# Patient Record
Sex: Female | Born: 1970 | Race: White | Hispanic: No | Marital: Married | Smoking: Never smoker
Health system: Southern US, Community
[De-identification: ages and names within clinical notes are randomized; demographics above are authoritative.]

## PROBLEM LIST (undated history)

## (undated) DIAGNOSIS — R5383 Other fatigue: Secondary | ICD-10-CM

## (undated) DIAGNOSIS — C801 Malignant (primary) neoplasm, unspecified: Secondary | ICD-10-CM

## (undated) DIAGNOSIS — J45990 Exercise induced bronchospasm: Secondary | ICD-10-CM

## (undated) DIAGNOSIS — G4733 Obstructive sleep apnea (adult) (pediatric): Secondary | ICD-10-CM

## (undated) DIAGNOSIS — K219 Gastro-esophageal reflux disease without esophagitis: Secondary | ICD-10-CM

## (undated) DIAGNOSIS — F419 Anxiety disorder, unspecified: Secondary | ICD-10-CM

## (undated) DIAGNOSIS — R109 Unspecified abdominal pain: Secondary | ICD-10-CM

## (undated) DIAGNOSIS — M7918 Myalgia, other site: Secondary | ICD-10-CM

## (undated) DIAGNOSIS — R519 Headache, unspecified: Secondary | ICD-10-CM

## (undated) DIAGNOSIS — G478 Other sleep disorders: Secondary | ICD-10-CM

## (undated) DIAGNOSIS — K2900 Acute gastritis without bleeding: Secondary | ICD-10-CM

## (undated) DIAGNOSIS — R0789 Other chest pain: Secondary | ICD-10-CM

## (undated) DIAGNOSIS — R51 Headache: Secondary | ICD-10-CM

## (undated) HISTORY — DX: Gastro-esophageal reflux disease without esophagitis: K21.9

## (undated) HISTORY — PX: TONSILLECTOMY: SHX5217

## (undated) HISTORY — DX: Acute gastritis without bleeding: K29.00

## (undated) HISTORY — DX: Obstructive sleep apnea (adult) (pediatric): G47.33

## (undated) HISTORY — DX: Unspecified abdominal pain: R10.9

## (undated) HISTORY — PX: TONSILLECTOMY: SUR1361

## (undated) HISTORY — DX: Exercise induced bronchospasm: J45.990

## (undated) HISTORY — DX: Other fatigue: R53.83

## (undated) HISTORY — DX: Myalgia, other site: M79.18

## (undated) HISTORY — DX: Other sleep disorders: G47.8

## (undated) HISTORY — DX: Other chest pain: R07.89

---

## 2001-02-13 HISTORY — PX: LAPAROSCOPY: SHX197

## 2001-02-13 HISTORY — PX: COLONOSCOPY: SHX5424

## 2007-08-26 ENCOUNTER — Ambulatory Visit: Payer: Self-pay

## 2009-01-26 ENCOUNTER — Ambulatory Visit: Payer: Self-pay | Admitting: Family Medicine

## 2009-04-12 ENCOUNTER — Ambulatory Visit: Payer: Self-pay | Admitting: Family Medicine

## 2011-04-10 IMAGING — CR DG CHEST 2V
1 series · 2 of 2 positions shown · non-contrast
Comparison: none

REASON FOR EXAM: chronic cough
COMMENTS:

PROCEDURE:     MDR - MDR CHEST PA(OR AP) AND LATERAL  - April 12, 2009  [DATE]
RESULT:     The lungs are clear. The cardiac silhouette and visualized bony
skeleton are unremarkable.

[Series 1: view not recorded · 0.17mm/px · 2 of 2 slices shown]
[im 1/2]
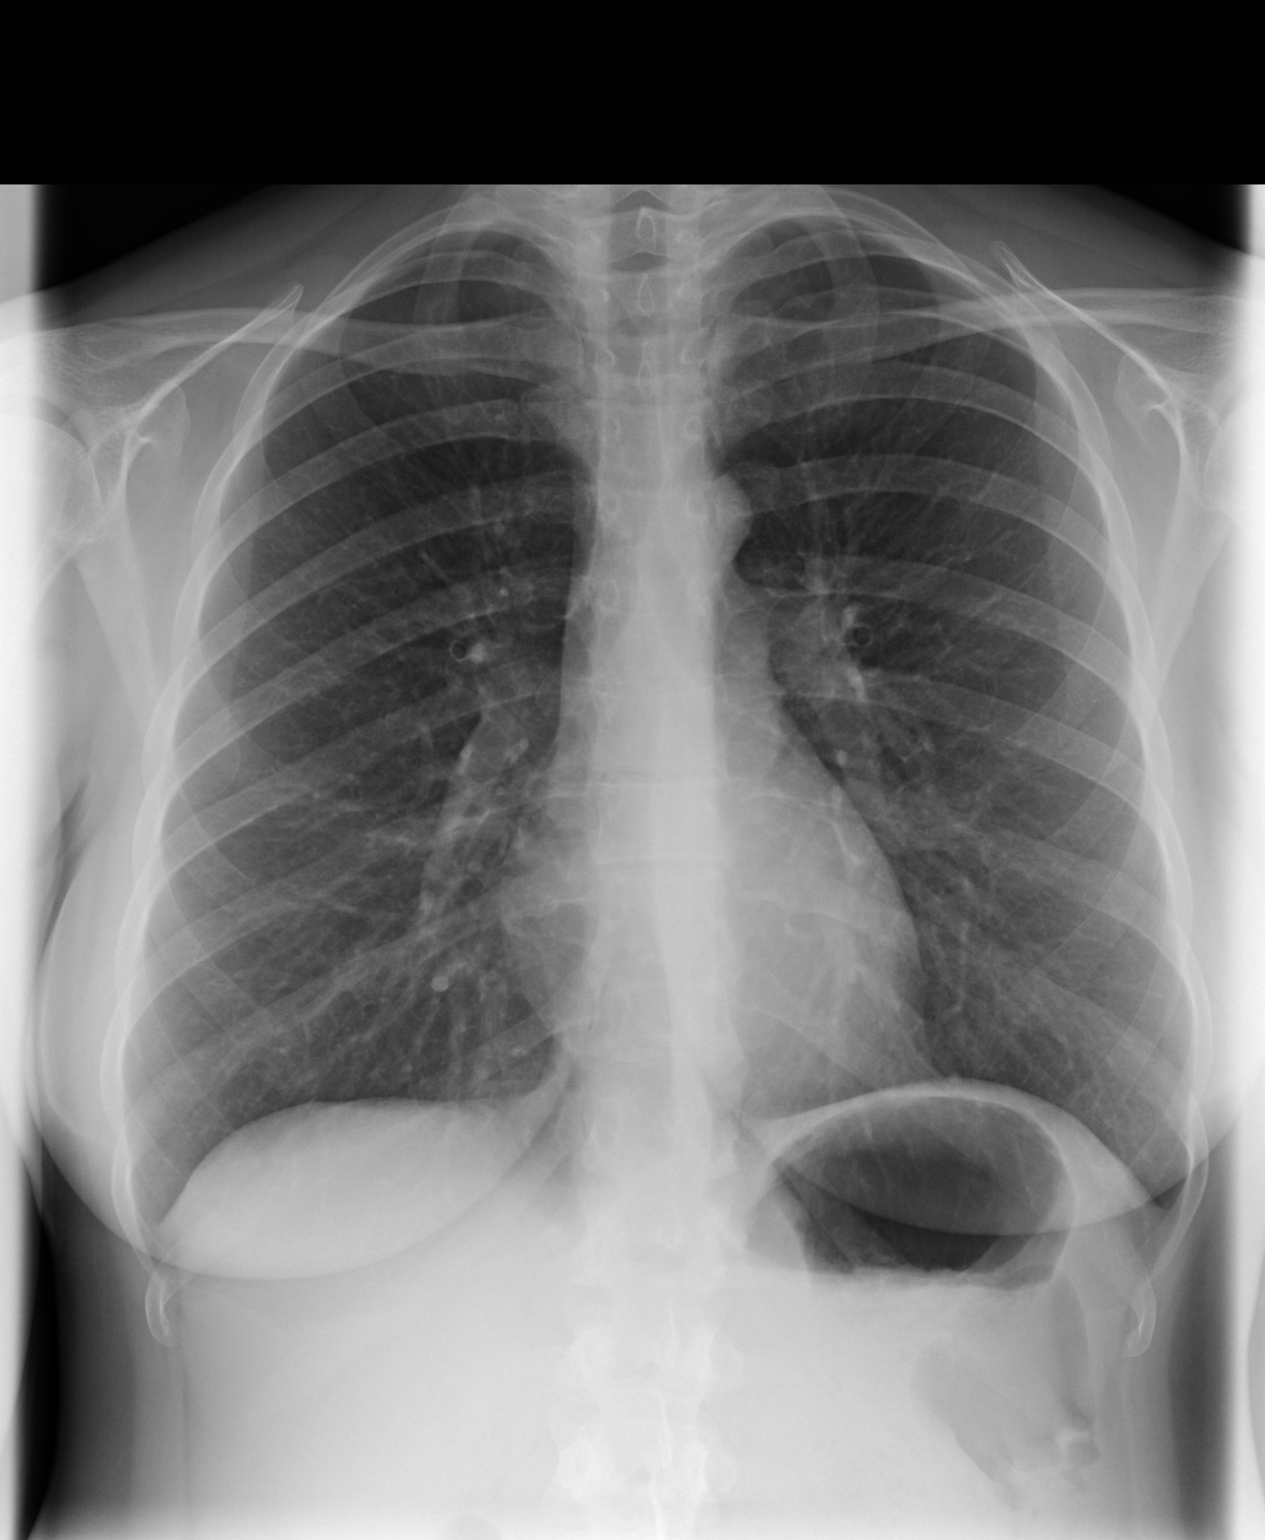
[im 2/2]
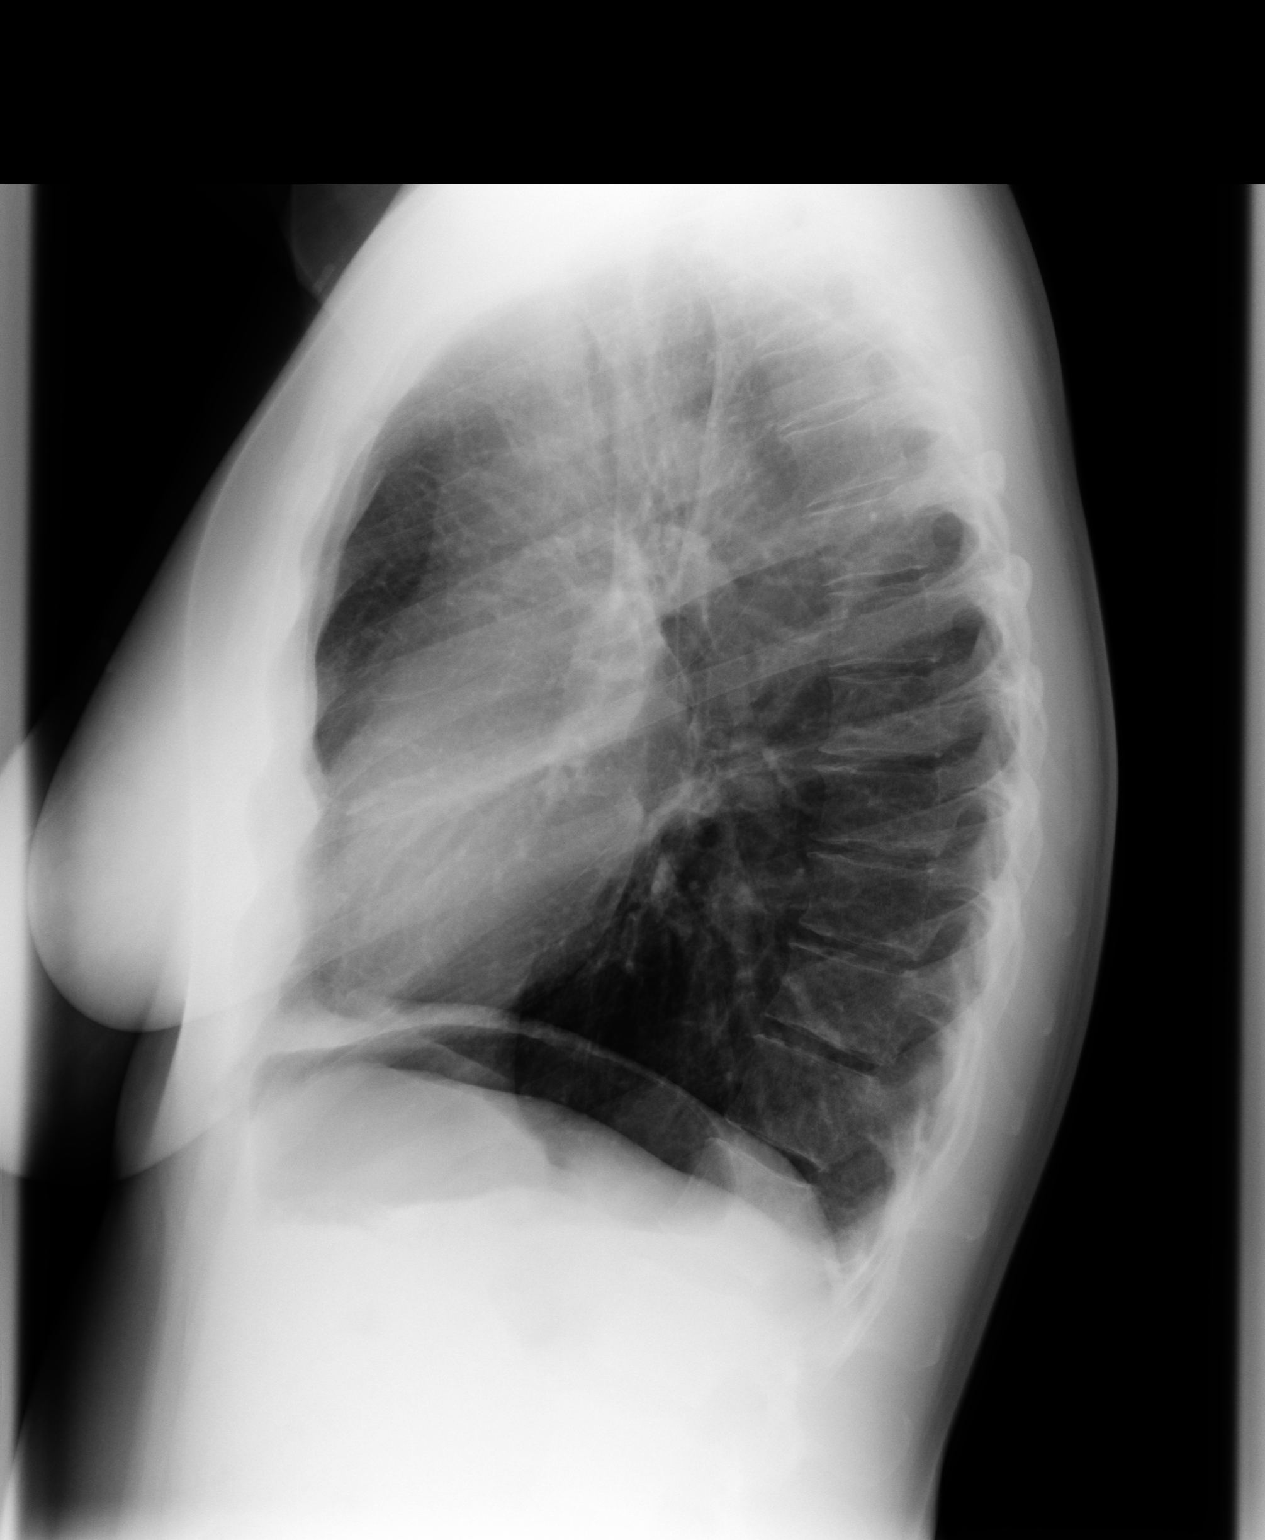

[2 of 2 positions shown; findings below may reference images not displayed]

IMPRESSION: 1. Chest radiograph without evidence of acute cardiopulmonary disease.

## 2012-11-21 LAB — HM PAP SMEAR: HM Pap smear: NEGATIVE

## 2012-11-22 ENCOUNTER — Ambulatory Visit: Payer: Self-pay | Admitting: Obstetrics and Gynecology

## 2012-11-29 ENCOUNTER — Ambulatory Visit: Payer: Self-pay | Admitting: Obstetrics and Gynecology

## 2013-11-25 ENCOUNTER — Ambulatory Visit: Payer: Self-pay | Admitting: Obstetrics and Gynecology

## 2014-06-05 ENCOUNTER — Ambulatory Visit
Admit: 2014-06-05 | Disposition: A | Discharge status: Home / Self Care | Payer: Self-pay | Attending: Family Medicine | Admitting: Family Medicine

## 2014-12-01 ENCOUNTER — Encounter: Payer: Self-pay | Admitting: Obstetrics and Gynecology

## 2014-12-22 ENCOUNTER — Encounter: Payer: Self-pay | Admitting: Obstetrics and Gynecology

## 2014-12-28 ENCOUNTER — Encounter: Payer: Self-pay | Admitting: Family Medicine

## 2014-12-28 ENCOUNTER — Ambulatory Visit (INDEPENDENT_AMBULATORY_CARE_PROVIDER_SITE_OTHER): Payer: BC Managed Care – PPO | Admitting: Family Medicine

## 2014-12-28 ENCOUNTER — Other Ambulatory Visit: Payer: Self-pay | Admitting: Family Medicine

## 2014-12-28 VITALS — BP 104/64 | HR 110 | Temp 98.2°F | Resp 16 | Ht 66.0 in | Wt 150.0 lb

## 2014-12-28 DIAGNOSIS — F411 Generalized anxiety disorder: Secondary | ICD-10-CM | POA: Diagnosis not present

## 2014-12-28 DIAGNOSIS — G478 Other sleep disorders: Secondary | ICD-10-CM | POA: Insufficient documentation

## 2014-12-28 DIAGNOSIS — J45990 Exercise induced bronchospasm: Secondary | ICD-10-CM

## 2014-12-28 DIAGNOSIS — R5383 Other fatigue: Secondary | ICD-10-CM | POA: Insufficient documentation

## 2014-12-28 MED ORDER — ALBUTEROL SULFATE HFA 108 (90 BASE) MCG/ACT IN AERS: 1.0000 | INHALATION_SPRAY | Freq: Four times a day (QID) | RESPIRATORY_TRACT | Status: DC | PRN

## 2014-12-28 MED ORDER — LORAZEPAM 0.5 MG PO TABS: 0.5000 mg | ORAL_TABLET | Freq: Two times a day (BID) | ORAL | Status: DC | PRN

## 2014-12-28 MED ORDER — VENLAFAXINE HCL ER 37.5 MG PO CP24: 37.5000 mg | ORAL_CAPSULE | Freq: Every day | ORAL | Status: DC

## 2014-12-28 NOTE — Progress Notes (Signed)
Name: Dana Fitzpatrick   MRN: [id number]    DOB: [birth year]   Date:12/28/2014       Progress Note  Subjective  Chief Complaint  Chief Complaint  Patient presents with  . Medication Management    HPI  Dana Fitzpatrick is a 44 year old female here today for refill of her prn albuterol inhaler. Mostly cold air and exercise induced bronchospasm with good response to rescue inhaler. Her insurance coverage will change Jan 2017 and albuterol may not be on it any more. Otherwise she reports continued chronic fatigue and if willing to accept it may be mood disorder related. Her mother was an anxious person and her sister is on Prozac, Wellbutrin and prn Clonazepam.   Anxiety: Patient complains of anxiety disorder, panic attacks and sleep disturbance.  She has the following symptoms: difficulty concentrating, fatigue, feelings of losing control, insomnia, irritable, racing thoughts. Onset of symptoms was approximately several years ago, gradually worsening since that time. She denies current suicidal and homicidal ideation. Family history significant for anxiety and depression.Possible organic causes contributing are: none. Risk factors: none Previous treatment includes none.    Past Medical History  Diagnosis Date  . Muscular abdominal pain in left flank   . Other fatigue   . Unrefreshed by sleep   . Mild exercise-induced asthma   . Acute superficial gastritis without hemorrhage   . Musculoskeletal chest pain     Patient Active Problem List   Diagnosis Date Noted  . Fatigue 12/28/2014  . Asthma, exercise induced 12/28/2014  . Non-restorative sleep 12/28/2014    Social History  Substance Use Topics  . Smoking status: Never Smoker   . Smokeless tobacco: Not on file  . Alcohol Use: 0.0 oz/week    0 Standard drinks or equivalent per week     Comment: occasional     Current outpatient prescriptions:  .  albuterol (VENTOLIN HFA) 108 (90 BASE) MCG/ACT inhaler, Inhale 1-2 puffs into  the lungs every 6 (six) hours as needed., Disp: , Rfl:  .  Cetirizine HCl (ZYRTEC ALLERGY) 10 MG CAPS, Take 1 capsule by mouth daily., Disp: , Rfl:  .  cyclobenzaprine (FLEXERIL) 5 MG tablet, Take 1-2 tablets by mouth at bedtime as needed., Disp: , Rfl:  .  ibuprofen (ADVIL,MOTRIN) 800 MG tablet, Take 1 tablet by mouth every 8 (eight) hours as needed., Disp: , Rfl:   Past Surgical History  Procedure Laterality Date  . Tonsillectomy    . Colonoscopy  2003    normal    Family History  Problem Relation Age of Onset  . Heart disease Mother   . Dementia Mother   . Heart attack Father     No Known Allergies   Review of Systems  CONSTITUTIONAL: No significant weight changes, fever, chills, weakness or fatigue.  SKIN: No rash or itching.  CARDIOVASCULAR: No chest pain, chest pressure or chest discomfort. No palpitations or edema.  RESPIRATORY: No shortness of breath, cough or sputum.  PSYCHIATRIC: Yes change in mood. No change in sleep pattern.  ENDOCRINOLOGIC: No reports of sweating, cold or heat intolerance. No polyuria or polydipsia.     Objective  BP 104/64 mmHg  Pulse 110  Temp(Src) 98.2 F (36.8 C) (Oral)  Resp 16  Ht 5\' 6"  (1.676 m)  Wt 150 lb (68.04 kg)  BMI 24.22 kg/m2  SpO2 96%  LMP 12/20/2014 (Exact Date) Body mass index is 24.22 kg/(m^2).  Physical Exam  Constitutional: Patient appears well-developed and well-nourished. In no  distress.  Cardiovascular: Normal rate, regular rhythm and normal heart sounds.  No murmur heard.  Pulmonary/Chest: Effort normal and breath sounds normal. No respiratory distress. Neurological: CN II-XII grossly intact with no focal deficits. Alert and oriented to person, place, and time. Coordination, balance, strength, speech and gait are normal.  Skin: Skin is warm and dry. No rash noted. No erythema.  Psychiatric: Patient has a anxious mood and affect. Behavior is normal in office today. Judgment and thought content normal in office  today.  Assessment & Plan   1. Asthma, exercise induced Refilled. Well controled.  - albuterol (VENTOLIN HFA) 108 (90 BASE) MCG/ACT inhaler; Inhale 1-2 puffs into the lungs every 6 (six) hours as needed.  Dispense: 18 g; Refill: 5  2. GAD (generalized anxiety disorder) New diagnosis. Will try Effexor XR along with PRN use of lorazepam. The patient has been counseled on the proper use, side effects and potential interactions of the new medication. Patient encouraged to review the side effects and safety profile pamphlet provided with the prescription from the pharmacy as well as request counseling from the pharmacy team as needed.   - venlafaxine XR (EFFEXOR-XR) 37.5 MG 24 hr capsule; Take 1 capsule (37.5 mg total) by mouth daily with breakfast.  Dispense: 30 capsule; Refill: 3 - lorazepam (ativan) 0.5 mg one po bid prn, #30, Refill: 3

## 2015-02-01 ENCOUNTER — Encounter: Payer: Self-pay | Admitting: *Deleted

## 2015-02-04 ENCOUNTER — Encounter: Payer: Self-pay | Admitting: Obstetrics and Gynecology

## 2015-02-04 ENCOUNTER — Ambulatory Visit (INDEPENDENT_AMBULATORY_CARE_PROVIDER_SITE_OTHER): Payer: BC Managed Care – PPO | Admitting: Obstetrics and Gynecology

## 2015-02-04 VITALS — BP 125/81 | HR 85 | Ht 65.0 in | Wt 147.0 lb

## 2015-02-04 DIAGNOSIS — Z01419 Encounter for gynecological examination (general) (routine) without abnormal findings: Secondary | ICD-10-CM | POA: Diagnosis not present

## 2015-02-04 NOTE — Progress Notes (Signed)
Subjective:   Dana Fitzpatrick is a 44 y.o. No obstetric history on file. Caucasian female here for a routine well-woman exam.  Patient's last menstrual period was 12/20/2014 (exact date).    Current complaints: none PCP: Sudaram       Does need &  desire labs  Social History: Sexual: heterosexual Marital Status: married Living situation: with spouse Occupation: Pharmacist, hospital at [employer] [place]: no tobacco use Illicit drugs: no history of illicit drug use  The following portions of the patient's history were reviewed and updated as appropriate: allergies, current medications, past family history, past medical history, past social history, past surgical history and problem list.  Past Medical History Past Medical History  Diagnosis Date  . Muscular abdominal pain in left flank   . Other fatigue   . Unrefreshed by sleep   . Mild exercise-induced asthma   . Acute superficial gastritis without hemorrhage   . Musculoskeletal chest pain   . GERD (gastroesophageal reflux disease)     Past Surgical History Past Surgical History  Procedure Laterality Date  . Tonsillectomy    . Colonoscopy  2003    normal  . Laparoscopy  2003    Gynecologic History No obstetric history on file.  Patient's last menstrual period was 12/20/2014 (exact date). Contraception: vasectomy Last Pap: 2015. Results were: normal Last mammogram: 2015. Results were: normal  Obstetric History OB History  No data available    Current Medications Current Outpatient Prescriptions on File Prior to Visit  Medication Sig Dispense Refill  . albuterol (VENTOLIN HFA) 108 (90 BASE) MCG/ACT inhaler Inhale 1-2 puffs into the lungs every 6 (six) hours as needed. 18 g 5  . albuterol (VENTOLIN HFA) 108 (90 BASE) MCG/ACT inhaler Inhale 1-2 puffs into the lungs every 6 (six) hours as needed. 18 g 5  . Cetirizine HCl (ZYRTEC ALLERGY) 10 MG CAPS Take 1 capsule by mouth daily.    . cyclobenzaprine (FLEXERIL) 5  MG tablet Take 1-2 tablets by mouth at bedtime as needed.    Marland Kitchen ibuprofen (ADVIL,MOTRIN) 800 MG tablet Take 1 tablet by mouth every 8 (eight) hours as needed.    Marland Kitchen LORazepam (ATIVAN) 0.5 MG tablet Take 1 tablet (0.5 mg total) by mouth 2 (two) times daily as needed for anxiety. 30 tablet 3  . venlafaxine XR (EFFEXOR-XR) 37.5 MG 24 hr capsule Take 1 capsule (37.5 mg total) by mouth daily with breakfast. 30 capsule 3   No current facility-administered medications on file prior to visit.    Review of Systems Patient denies any headaches, blurred vision, shortness of breath, chest pain, abdominal pain, problems with bowel movements, urination, or intercourse.  Objective:  BP 125/81 mmHg  Pulse 85  Ht 5\' 5"  (1.651 m)  Wt 147 lb (66.679 kg)  BMI 24.46 kg/m2  LMP 12/20/2014 (Exact Date) Physical Exam  General:  Well developed, well nourished, no acute distress. She is alert and oriented x3. Skin:  Warm and dry Neck:  Midline trachea, no thyromegaly or nodules Cardiovascular: Regular rate and rhythm, no murmur heard Lungs:  Effort normal, all lung fields clear to auscultation bilaterally Breasts:  No dominant palpable mass, retraction, or nipple discharge Abdomen:  Soft, non tender, no hepatosplenomegaly or masses Pelvic:  External genitalia is normal in appearance.  The vagina is normal in appearance. The cervix is bulbous, no CMT.  Thin prep pap is not done . Uterus is felt to be normal size, shape, and contour.  No adnexal masses or tenderness noted.*  Extremities:  No swelling or varicosities noted Psych:  She has a normal mood and affect  Assessment:   Healthy well-woman exam Anxiety improved with SNRI ( taking for 6 weeks)  Plan:  Routine screening labs obtained F/U 1 year for AE, or sooner if needed Mammogram scheduled  Maanav Kassabian Rockney Ghee, CNM

## 2015-02-04 NOTE — Patient Instructions (Signed)
Place annual gynecologic exam patient instructions here.

## 2015-02-05 ENCOUNTER — Telehealth: Payer: Self-pay | Admitting: *Deleted

## 2015-02-05 ENCOUNTER — Other Ambulatory Visit: Payer: Self-pay | Admitting: Obstetrics and Gynecology

## 2015-02-05 DIAGNOSIS — E559 Vitamin D deficiency, unspecified: Secondary | ICD-10-CM | POA: Insufficient documentation

## 2015-02-05 LAB — COMPREHENSIVE METABOLIC PANEL
A/G RATIO: 1.6 (ref 1.1–2.5)
ALT: 10 IU/L (ref 0–32)
AST: 19 IU/L (ref 0–40)
Albumin: 4.2 g/dL (ref 3.5–5.5)
Alkaline Phosphatase: 80 IU/L (ref 39–117)
BUN/Creatinine Ratio: 13 (ref 9–23)
BUN: 10 mg/dL (ref 6–24)
Bilirubin Total: 0.5 mg/dL (ref 0.0–1.2)
CALCIUM: 9.3 mg/dL (ref 8.7–10.2)
CO2: 22 mmol/L (ref 18–29)
CREATININE: 0.75 mg/dL (ref 0.57–1.00)
Chloride: 99 mmol/L (ref 96–106)
GFR, EST AFRICAN AMERICAN: 112 mL/min/{1.73_m2} (ref >59)
GFR, EST NON AFRICAN AMERICAN: 97 mL/min/{1.73_m2} (ref >59)
Globulin, Total: 2.7 g/dL (ref 1.5–4.5)
Glucose: 78 mg/dL (ref 65–99)
POTASSIUM: 4.3 mmol/L (ref 3.5–5.2)
Sodium: 138 mmol/L (ref 134–144)
TOTAL PROTEIN: 6.9 g/dL (ref 6.0–8.5)

## 2015-02-05 LAB — LIPID PANEL
CHOL/HDL RATIO: 3.4 ratio (ref 0.0–4.4)
Cholesterol, Total: 204 mg/dL — ABNORMAL HIGH (ref 100–199)
HDL: 60 mg/dL (ref >39)
LDL Calculated: 117 mg/dL — ABNORMAL HIGH (ref 0–99)
TRIGLYCERIDES: 137 mg/dL (ref 0–149)
VLDL Cholesterol Cal: 27 mg/dL (ref 5–40)

## 2015-02-05 LAB — VITAMIN D 25 HYDROXY (VIT D DEFICIENCY, FRACTURES): Vit D, 25-Hydroxy: 21 ng/mL — ABNORMAL LOW (ref 30.0–100.0)

## 2015-02-05 MED ORDER — VITAMIN D (ERGOCALCIFEROL) 1.25 MG (50000 UNIT) PO CAPS: 50000.0000 [IU] | ORAL_CAPSULE | ORAL | Status: DC

## 2015-02-05 NOTE — Telephone Encounter (Signed)
Mailed info to pt-ac

## 2015-02-05 NOTE — Telephone Encounter (Signed)
-----   Message from Joylene Igo, [place] sent at 02/05/2015  8:16 AM EST ----- Please mail info on vit D def.

## 2015-02-09 ENCOUNTER — Other Ambulatory Visit: Payer: Self-pay | Admitting: Obstetrics and Gynecology

## 2015-02-09 ENCOUNTER — Ambulatory Visit
Admission: RE | Admit: 2015-02-09 | Discharge: 2015-02-09 | Disposition: A | Discharge status: Home / Self Care | Payer: BC Managed Care – PPO | Source: Ambulatory Visit | Attending: Obstetrics and Gynecology | Admitting: Obstetrics and Gynecology

## 2015-02-09 DIAGNOSIS — Z1231 Encounter for screening mammogram for malignant neoplasm of breast: Secondary | ICD-10-CM | POA: Diagnosis not present

## 2015-02-09 DIAGNOSIS — R928 Other abnormal and inconclusive findings on diagnostic imaging of breast: Secondary | ICD-10-CM

## 2015-02-09 DIAGNOSIS — Z01419 Encounter for gynecological examination (general) (routine) without abnormal findings: Secondary | ICD-10-CM

## 2015-02-23 ENCOUNTER — Ambulatory Visit
Admission: RE | Admit: 2015-02-23 | Discharge: 2015-02-23 | Disposition: A | Discharge status: Home / Self Care | Payer: BC Managed Care – PPO | Source: Ambulatory Visit | Attending: Obstetrics and Gynecology | Admitting: Obstetrics and Gynecology

## 2015-02-23 DIAGNOSIS — R928 Other abnormal and inconclusive findings on diagnostic imaging of breast: Secondary | ICD-10-CM

## 2015-02-23 DIAGNOSIS — N6001 Solitary cyst of right breast: Secondary | ICD-10-CM | POA: Insufficient documentation

## 2015-03-29 ENCOUNTER — Encounter: Payer: Self-pay | Admitting: Obstetrics and Gynecology

## 2015-04-09 ENCOUNTER — Ambulatory Visit (INDEPENDENT_AMBULATORY_CARE_PROVIDER_SITE_OTHER): Payer: BC Managed Care – PPO | Admitting: Family Medicine

## 2015-04-09 ENCOUNTER — Encounter: Payer: Self-pay | Admitting: Family Medicine

## 2015-04-09 VITALS — BP 124/82 | HR 97 | Temp 98.3°F | Resp 14 | Ht 65.0 in | Wt 152.0 lb

## 2015-04-09 DIAGNOSIS — J45990 Exercise induced bronchospasm: Secondary | ICD-10-CM

## 2015-04-09 DIAGNOSIS — F411 Generalized anxiety disorder: Secondary | ICD-10-CM

## 2015-04-09 DIAGNOSIS — J069 Acute upper respiratory infection, unspecified: Secondary | ICD-10-CM | POA: Insufficient documentation

## 2015-04-09 MED ORDER — AMOXICILLIN 875 MG PO TABS: 875.0000 mg | ORAL_TABLET | Freq: Two times a day (BID) | ORAL | Status: DC

## 2015-04-09 MED ORDER — VENLAFAXINE HCL ER 75 MG PO CP24: 75.0000 mg | ORAL_CAPSULE | Freq: Every day | ORAL | Status: DC

## 2015-04-09 MED ORDER — PREDNISONE 10 MG (21) PO TBPK: ORAL_TABLET | ORAL | Status: DC

## 2015-04-09 NOTE — Progress Notes (Signed)
Name: Dana Fitzpatrick   MRN: [id number]    DOB: [birth year]   Date:04/09/2015       Progress Note  Subjective  Chief Complaint  Chief Complaint  Patient presents with  . Medication Refill  . Depression  . Anxiety  . URI    HPI   Dana Fitzpatrick is a 45 year old female here today for general f/u and refills. Mostly cold air and exercise induced bronchospasm with good response to rescue inhaler. Otherwise she reports continued chronic fatigue and if willing to accept it may be mood disorder related. Her mother was an anxious person and her sister is on Prozac, Wellbutrin and prn Clonazepam.   Patient complains of anxiety disorder, panic attacks and sleep disturbance. She has the following symptoms: difficulty concentrating, fatigue, feelings of losing control, insomnia, irritable, racing thoughts. Onset of symptoms was approximately several years ago, gradually worsening since that time. She denies current suicidal and homicidal ideation. Family history significant for anxiety and depression. Possible organic causes contributing are: none. Risk factors: family history. Patient started on Effexor XR 37.5mg  one a day at previous visit along with prn use of lorazepam 0.5mg  po bid. She states the Effexor is helping and would like to increase dose. She did not need to try the lorazepam just yet.   Patient is also here today with concerns regarding the following symptoms sore throat, congestion, post nasal drip, ear pressure and non productive cough that started 5 days ago.  Associated with fatigue. Occasional dizziness. Not associated with fever. Has tried the following home remedies: allergy meds    Past Medical History  Diagnosis Date  . Muscular abdominal pain in left flank   . Other fatigue   . Unrefreshed by sleep   . Mild exercise-induced asthma   . Acute superficial gastritis without hemorrhage   . Musculoskeletal chest pain   . GERD (gastroesophageal reflux disease)     Social  History  Substance Use Topics  . Smoking status: Never Smoker   . Smokeless tobacco: Not on file  . Alcohol Use: 0.0 oz/week    0 Standard drinks or equivalent per week     Comment: occasional     Current outpatient prescriptions:  .  albuterol (VENTOLIN HFA) 108 (90 BASE) MCG/ACT inhaler, Inhale 1-2 puffs into the lungs every 6 (six) hours as needed., Disp: 18 g, Rfl: 5 .  Cetirizine HCl (ZYRTEC ALLERGY) 10 MG CAPS, Take 1 capsule by mouth daily., Disp: , Rfl:  .  cyclobenzaprine (FLEXERIL) 5 MG tablet, Take 1-2 tablets by mouth at bedtime as needed., Disp: , Rfl:  .  ibuprofen (ADVIL,MOTRIN) 800 MG tablet, Take 1 tablet by mouth every 8 (eight) hours as needed., Disp: , Rfl:  .  LORazepam (ATIVAN) 0.5 MG tablet, Take 1 tablet (0.5 mg total) by mouth 2 (two) times daily as needed for anxiety., Disp: 30 tablet, Rfl: 3 .  venlafaxine XR (EFFEXOR-XR) 37.5 MG 24 hr capsule, Take 1 capsule (37.5 mg total) by mouth daily with breakfast., Disp: 30 capsule, Rfl: 3 .  Vitamin D, Ergocalciferol, (DRISDOL) 50000 UNITS CAPS capsule, Take 1 capsule (50,000 Units total) by mouth every 7 (seven) days., Disp: 30 capsule, Rfl: 2  No Known Allergies  ROS  CONSTITUTIONAL: No significant weight changes, fever, chills, weakness. Yes chronic fatigue.  HEENT:  - Eyes: No visual changes.  - Ears: No auditory changes. No pain.  - Nose: Yes congestion. - Throat: No sore throat. No changes in swallowing. SKIN:  No rash or itching.  CARDIOVASCULAR: No chest pain, chest pressure or chest discomfort. No palpitations or edema.  RESPIRATORY: No shortness of breath, cough or sputum.  GASTROINTESTINAL: No anorexia, nausea, vomiting. No changes in bowel habits. No abdominal pain or blood.  NEUROLOGICAL: No headache, syncope, paralysis, ataxia, numbness or tingling in the extremities. No memory changes. No change in bowel or bladder control.  MUSCULOSKELETAL: No joint pain. No muscle pain. HEMATOLOGIC: No anemia,  bleeding or bruising.  LYMPHATICS: No enlarged lymph nodes.  PSYCHIATRIC: No change in mood. No change in sleep pattern.  ENDOCRINOLOGIC: No reports of sweating, cold or heat intolerance. No polyuria or polydipsia.      Objective  Filed Vitals:   04/09/15 0802  BP: 124/82  Pulse: 97  Temp: 98.3 F (36.8 C)  TempSrc: Oral  Resp: 14  Height: 5\' 5"  (1.651 m)  Weight: 152 lb (68.947 kg)  SpO2: 98%   Body mass index is 25.29 kg/(m^2).   Physical Exam  Constitutional: Patient appears well-developed and well-nourished. In no acute distress. HEENT:  - Head: Normocephalic and atraumatic.  - Ears: RIGHT TM bulging with minimal clear exudate, LEFT TM bulging with minimal clear exudate.  - Nose: Nasal mucosa boggy and congested.  - Mouth/Throat: Oropharynx is moist with slight erythema of bilateral tonsils without hypertrophy or exudates. Post nasal drainage present.  - Eyes: Conjunctivae clear, EOM movements normal. PERRLA. No scleral icterus.  Neck: Normal range of motion. Neck supple. No JVD present. No thyromegaly present. No local lymphadenopathy. Cardiovascular: Regular rate, regular rhythm with no murmurs heard.  Pulmonary/Chest: Effort normal and breath sounds clear in all lung fields.  Musculoskeletal: Normal range of motion bilateral UE and LE, no joint effusions. Skin: Skin is warm and dry. No rash noted. Psychiatric: Patient has a stable mood and affect. Behavior is normal in office today. Judgment and thought content normal in office today.   Assessment & Plan  1. GAD (generalized anxiety disorder) Improving. Increased Effexor to next dose.   - venlafaxine XR (EFFEXOR-XR) 75 MG 24 hr capsule; Take 1 capsule (75 mg total) by mouth daily with breakfast.  Dispense: 30 capsule; Refill: 5  2. Asthma, exercise induced Well controled.   3. Upper respiratory infection Etiologies include initial allergic rhinitis or viral infection at this time. Instructed patient on  increasing hydration, nasal saline spray, steam inhalation, NSAID if tolerated and not contraindicated. If not already doing so start taking daily anti-histamine and use a steroid nasal spray. If symptoms persist/worsen may consider antibiotic therapy and prednisone.   - amoxicillin (AMOXIL) 875 MG tablet; Take 1 tablet (875 mg total) by mouth 2 (two) times daily.  Dispense: 20 tablet; Refill: 0 - predniSONE (STERAPRED UNI-PAK 21 TAB) 10 MG (21) TBPK tablet; Use as directed in a 6 day taper PredPak  Dispense: 21 tablet; Refill: 0

## 2015-07-15 ENCOUNTER — Ambulatory Visit (INDEPENDENT_AMBULATORY_CARE_PROVIDER_SITE_OTHER): Payer: BC Managed Care – PPO | Admitting: Family Medicine

## 2015-07-15 ENCOUNTER — Encounter: Payer: Self-pay | Admitting: Family Medicine

## 2015-07-15 VITALS — BP 132/75 | HR 122 | Temp 98.5°F | Resp 19 | Ht 65.0 in | Wt 154.3 lb

## 2015-07-15 DIAGNOSIS — J0110 Acute frontal sinusitis, unspecified: Secondary | ICD-10-CM

## 2015-07-15 MED ORDER — FLUTICASONE PROPIONATE 50 MCG/ACT NA SUSP: 2.0000 | Freq: Every day | NASAL | Status: DC

## 2015-07-15 MED ORDER — BENZONATATE 200 MG PO CAPS: 200.0000 mg | ORAL_CAPSULE | Freq: Three times a day (TID) | ORAL | Status: DC | PRN

## 2015-07-15 MED ORDER — AMOXICILLIN-POT CLAVULANATE 875-125 MG PO TABS: 1.0000 | ORAL_TABLET | Freq: Two times a day (BID) | ORAL | Status: DC

## 2015-07-15 NOTE — Progress Notes (Signed)
Name: Dana Fitzpatrick   MRN: [id number]    DOB: October 07, 1970   Date:07/15/2015       Progress Note  Subjective  Chief Complaint  Chief Complaint  Patient presents with  . Acute Visit    Cough     Cough This is a new problem. The current episode started in the past 7 days. The cough is productive of sputum. Associated symptoms include ear congestion, a fever and nasal congestion. Pertinent negatives include no chest pain. Treatments tried: Cough drops, Mucinex (once), and Inhaler. Her past medical history is significant for environmental allergies. There is no history of asthma (Exercise/cold weather induced bronchospasm), bronchitis or COPD.   Pt. 'lost my voice' for four days starting last weekend, since then she has recovered her voice but feels congested, morning cough with sputum, and sinus pressure.    Past Medical History  Diagnosis Date  . Muscular abdominal pain in left flank   . Other fatigue   . Unrefreshed by sleep   . Mild exercise-induced asthma   . Acute superficial gastritis without hemorrhage   . Musculoskeletal chest pain   . GERD (gastroesophageal reflux disease)     Past Surgical History  Procedure Laterality Date  . Tonsillectomy    . Colonoscopy  2003    normal  . Laparoscopy  2003    Family History  Problem Relation Age of Onset  . Heart disease Mother   . Dementia Mother   . Heart attack Father   . Breast cancer Paternal Aunt 55    Social History   Social History  . Marital Status: Married    Spouse Name: N/A  . Number of Children: N/A  . Years of Education: N/A   Occupational History  . Not on file.   Social History Main Topics  . Smoking status: Never Smoker   . Smokeless tobacco: Not on file  . Alcohol Use: 0.0 oz/week    0 Standard drinks or equivalent per week     Comment: occasional  . Drug Use: No  . Sexual Activity:    Partners: Male   Other Topics Concern  . Not on file   Social History Narrative     Current  outpatient prescriptions:  .  albuterol (VENTOLIN HFA) 108 (90 BASE) MCG/ACT inhaler, Inhale 1-2 puffs into the lungs every 6 (six) hours as needed., Disp: 18 g, Rfl: 5 .  Cetirizine HCl (ZYRTEC ALLERGY) 10 MG CAPS, Take 1 capsule by mouth daily., Disp: , Rfl:  .  cyclobenzaprine (FLEXERIL) 5 MG tablet, Take 1-2 tablets by mouth at bedtime as needed., Disp: , Rfl:  .  ibuprofen (ADVIL,MOTRIN) 800 MG tablet, Take 1 tablet by mouth every 8 (eight) hours as needed., Disp: , Rfl:  .  LORazepam (ATIVAN) 0.5 MG tablet, Take 1 tablet (0.5 mg total) by mouth 2 (two) times daily as needed for anxiety., Disp: 30 tablet, Rfl: 3 .  venlafaxine XR (EFFEXOR-XR) 75 MG 24 hr capsule, Take 1 capsule (75 mg total) by mouth daily with breakfast., Disp: 30 capsule, Rfl: 5 .  Vitamin D, Ergocalciferol, (DRISDOL) 50000 UNITS CAPS capsule, Take 1 capsule (50,000 Units total) by mouth every 7 (seven) days., Disp: 30 capsule, Rfl: 2  No Known Allergies   Review of Systems  Constitutional: Positive for fever.  HENT: Positive for congestion.   Respiratory: Positive for cough and sputum production.   Cardiovascular: Negative for chest pain.  Endo/Heme/Allergies: Positive for environmental allergies.     Objective  Filed Vitals:   07/15/15 1406  BP: 132/75  Pulse: 122  Temp: 98.5 F (36.9 C)  TempSrc: Oral  Resp: 19  Height: 5\' 5"  (1.651 m)  Weight: 154 lb 4.8 oz (69.99 kg)  SpO2: 97%    Physical Exam  Constitutional: She is well-developed, well-nourished, and in no distress.  HENT:  Head: Normocephalic and atraumatic.  Right Ear: Tympanic membrane and ear canal normal.  Left Ear: Tympanic membrane and ear canal normal.  Nose: Right sinus exhibits frontal sinus tenderness. Left sinus exhibits frontal sinus tenderness.  Mouth/Throat: Posterior oropharyngeal erythema present. No oropharyngeal exudate or posterior oropharyngeal edema.  Nasal turbinate hypertrophy R worse than left, inflamed mucosa.    Cardiovascular: Regular rhythm and normal heart sounds.  Tachycardia present.   Pulmonary/Chest: Effort normal and breath sounds normal.  Nursing note and vitals reviewed.     Assessment & Plan  1. Acute non-recurrent frontal sinusitis Persistent symptoms, will start on antibiotic and nasal steroid therapy. - benzonatate (TESSALON) 200 MG capsule; Take 1 capsule (200 mg total) by mouth 3 (three) times daily as needed for cough.  Dispense: 20 capsule; Refill: 0 - amoxicillin-clavulanate (AUGMENTIN) 875-125 MG tablet; Take 1 tablet by mouth 2 (two) times daily.  Dispense: 20 tablet; Refill: 0 - fluticasone (FLONASE) 50 MCG/ACT nasal spray; Place 2 sprays into both nostrils daily.  Dispense: 16 g; Refill: 1   Neddie Steedman [facility] 07/15/2015 2:19 PM

## 2015-10-04 ENCOUNTER — Encounter: Payer: Self-pay | Admitting: Obstetrics and Gynecology

## 2015-10-11 ENCOUNTER — Other Ambulatory Visit: Payer: Self-pay

## 2015-10-11 DIAGNOSIS — F411 Generalized anxiety disorder: Secondary | ICD-10-CM

## 2015-10-11 MED ORDER — VENLAFAXINE HCL ER 75 MG PO CP24: 75.0000 mg | ORAL_CAPSULE | Freq: Every day | ORAL | 0 refills | Status: DC

## 2015-10-11 NOTE — Telephone Encounter (Signed)
Please ask pt to schedule an appt in the next month; I sent one month of Effexor XR in as requested; thank you

## 2015-10-12 NOTE — Telephone Encounter (Signed)
Left voice mail

## 2015-11-02 ENCOUNTER — Encounter: Payer: Self-pay | Admitting: Family Medicine

## 2015-11-02 ENCOUNTER — Ambulatory Visit (INDEPENDENT_AMBULATORY_CARE_PROVIDER_SITE_OTHER): Payer: BC Managed Care – PPO | Admitting: Family Medicine

## 2015-11-02 VITALS — BP 116/68 | HR 94 | Temp 98.7°F | Wt 164.2 lb

## 2015-11-02 DIAGNOSIS — R635 Abnormal weight gain: Secondary | ICD-10-CM | POA: Insufficient documentation

## 2015-11-02 DIAGNOSIS — R5383 Other fatigue: Secondary | ICD-10-CM | POA: Diagnosis not present

## 2015-11-02 DIAGNOSIS — N926 Irregular menstruation, unspecified: Secondary | ICD-10-CM

## 2015-11-02 DIAGNOSIS — F411 Generalized anxiety disorder: Secondary | ICD-10-CM

## 2015-11-02 DIAGNOSIS — E785 Hyperlipidemia, unspecified: Secondary | ICD-10-CM

## 2015-11-02 DIAGNOSIS — E559 Vitamin D deficiency, unspecified: Secondary | ICD-10-CM

## 2015-11-02 LAB — LIPID PANEL
Cholesterol: 200 mg/dL (ref 125–200)
HDL: 73 mg/dL (ref >=46)
LDL Cholesterol: 95 mg/dL (ref <130)
Total CHOL/HDL Ratio: 2.7 Ratio (ref <=5.0)
Triglycerides: 161 mg/dL — ABNORMAL HIGH (ref <150)
VLDL: 32 mg/dL — ABNORMAL HIGH (ref <30)

## 2015-11-02 LAB — CBC WITH DIFFERENTIAL/PLATELET
Basophils Absolute: 0 cells/uL (ref 0–200)
Basophils Relative: 0 %
EOS ABS: 76 {cells}/uL (ref 15–500)
Eosinophils Relative: 1 %
HEMATOCRIT: 42.3 % (ref 35.0–45.0)
HEMOGLOBIN: 14.4 g/dL (ref 11.7–15.5)
LYMPHS ABS: 1976 {cells}/uL (ref 850–3900)
Lymphocytes Relative: 26 %
MCH: 32.4 pg (ref 27.0–33.0)
MCHC: 34 g/dL (ref 32.0–36.0)
MCV: 95.3 fL (ref 80.0–100.0)
MONO ABS: 532 {cells}/uL (ref 200–950)
MONOS PCT: 7 %
MPV: 9.9 fL (ref 7.5–12.5)
NEUTROS ABS: 5016 {cells}/uL (ref 1500–7800)
Neutrophils Relative %: 66 %
Platelets: 321 10*3/uL (ref 140–400)
RBC: 4.44 MIL/uL (ref 3.80–5.10)
RDW: 13.1 % (ref 11.0–15.0)
WBC: 7.6 10*3/uL (ref 3.8–10.8)

## 2015-11-02 LAB — BASIC METABOLIC PANEL WITH GFR
BUN: 9 mg/dL (ref 7–25)
CO2: 27 mmol/L (ref 20–31)
CREATININE: 0.65 mg/dL (ref 0.50–1.10)
Calcium: 9.3 mg/dL (ref 8.6–10.2)
Chloride: 102 mmol/L (ref 98–110)
GFR, Est Non African American: >89 mL/min (ref >=60)
Glucose, Bld: 90 mg/dL (ref 65–99)
Potassium: 4.3 mmol/L (ref 3.5–5.3)
Sodium: 137 mmol/L (ref 135–146)

## 2015-11-02 LAB — TSH: TSH: 1.27 m[IU]/L

## 2015-11-02 LAB — T4, FREE: FREE T4: 0.9 ng/dL (ref 0.8–1.8)

## 2015-11-02 MED ORDER — VENLAFAXINE HCL ER 75 MG PO CP24: 75.0000 mg | ORAL_CAPSULE | Freq: Every day | ORAL | 3 refills | Status: DC

## 2015-11-02 NOTE — Assessment & Plan Note (Signed)
Check TSH and add medicine if indicated

## 2015-11-02 NOTE — Assessment & Plan Note (Signed)
Check vit D since it's a fat soluble vitamin

## 2015-11-02 NOTE — Assessment & Plan Note (Signed)
Check fasting lipids, limit saturated fats 

## 2015-11-02 NOTE — Progress Notes (Signed)
BP 116/68   Pulse 94   Temp 98.7 F (37.1 C)   Wt 164 lb 3.2 oz (74.5 kg)   LMP 11/02/2015   SpO2 97%   BMI 27.32 kg/m    Subjective:    Patient ID: Dana Fitzpatrick, female    DOB: 1970/05/05, 45 y.o.   MRN: [id number]  HPI: Dana Fitzpatrick is a 45 y.o. female  Chief Complaint  Patient presents with  . Medication Refill   Patient is here for f/u  She has gained ten pounds; she is tired, has been going on for a while; wanting to sleep all the time Periods are out of whack; , some are later than normal, skipping some; was two weeks and she contacted Melody's office, then spotted and  Some constipation, not too bad No thyroid disease in the family  Allergic rhinitis; on zyrtec and flonase; uses zyrtec D occasionally  She is on effexor and lorazepam; she is not using lorazepam; using venlafaxine only  She had pulled muscle in her back every now and again; was using flexeril and ibuprofen; not using now  Vitamin D deficiency; was on 50k weekly since Decv  High cholesterol; total 204; LDL 117; parents have high cholesterol; heart disease in the family; no chest pain; tries to eat a little healthier  Depression screen [facility] 2/9 11/02/2015 07/15/2015 04/09/2015 12/28/2014  Decreased Interest 0 0 0 0  Down, Depressed, Hopeless 0 0 0 0  PHQ - 2 Score 0 0 0 0   Relevant past medical, surgical, family and social history reviewed Past Medical History:  Diagnosis Date  . Acute superficial gastritis without hemorrhage   . GERD (gastroesophageal reflux disease)   . Mild exercise-induced asthma   . Muscular abdominal pain in left flank   . Musculoskeletal chest pain   . Other fatigue   . Unrefreshed by sleep    Past Surgical History:  Procedure Laterality Date  . COLONOSCOPY  2003   normal  . LAPAROSCOPY  2003  . TONSILLECTOMY     Family History  Problem Relation Age of Onset  . Heart disease Mother   . Dementia Mother   . Heart attack Father   . Breast cancer  Paternal Aunt 61   Social History  Substance Use Topics  . Smoking status: Never Smoker  . Smokeless tobacco: Not on file  . Alcohol use 0.0 oz/week     Comment: occasional   Interim medical history since last visit reviewed. Allergies and medications reviewed  Review of Systems Per HPI unless specifically indicated above     Objective:    BP 116/68   Pulse 94   Temp 98.7 F (37.1 C)   Wt 164 lb 3.2 oz (74.5 kg)   LMP 11/02/2015   SpO2 97%   BMI 27.32 kg/m   Wt Readings from Last 3 Encounters:  11/02/15 164 lb 3.2 oz (74.5 kg)  07/15/15 154 lb 4.8 oz (70 kg)  04/09/15 152 lb (68.9 kg)    Physical Exam  Constitutional: She appears well-developed and well-nourished. No distress.  Weight gain 10 pounds over last 3 months  HENT:  Head: Normocephalic and atraumatic.  Eyes: EOM are normal. No scleral icterus.  Neck: No thyromegaly present.  Cardiovascular: Normal rate, regular rhythm and normal heart sounds.   No murmur heard. Pulmonary/Chest: Effort normal and breath sounds normal. No respiratory distress. She has no wheezes.  Abdominal: Soft. Bowel sounds are normal. She exhibits no distension.  Musculoskeletal:  Normal range of motion. She exhibits no edema.  Neurological: She is alert. She exhibits normal muscle tone.  Reflex Scores:      Patellar reflexes are 2+ on the right side and 2+ on the left side. Skin: Skin is warm and dry. She is not diaphoretic. No pallor.  Psychiatric: She has a normal mood and affect. Her behavior is normal. Judgment and thought content normal.   Results for orders placed or performed in visit on 02/04/15  Lipid panel  Result Value Ref Range   Cholesterol, Total 204 (H) 100 - 199 mg/dL   Triglycerides 137 0 - 149 mg/dL   HDL 60 >39 mg/dL   VLDL Cholesterol Cal 27 5 - 40 mg/dL   LDL Calculated 117 (H) 0 - 99 mg/dL   Chol/HDL Ratio 3.4 0.0 - 4.4 ratio units  Comprehensive metabolic panel  Result Value Ref Range   Glucose 78 65 - 99  mg/dL   BUN 10 6 - 24 mg/dL   Creatinine, Ser 0.75 0.57 - 1.00 mg/dL   GFR calc non Af Amer 97 >59 mL/min/1.73   GFR calc Af Amer 112 >59 mL/min/1.73   BUN/Creatinine Ratio 13 9 - 23   Sodium 138 134 - 144 mmol/L   Potassium 4.3 3.5 - 5.2 mmol/L   Chloride 99 96 - 106 mmol/L   CO2 22 18 - 29 mmol/L   Calcium 9.3 8.7 - 10.2 mg/dL   Total Protein 6.9 6.0 - 8.5 g/dL   Albumin 4.2 3.5 - 5.5 g/dL   Globulin, Total 2.7 1.5 - 4.5 g/dL   Albumin/Globulin Ratio 1.6 1.1 - 2.5   Bilirubin Total 0.5 0.0 - 1.2 mg/dL   Alkaline Phosphatase 80 39 - 117 IU/L   AST 19 0 - 40 IU/L   ALT 10 0 - 32 IU/L  Vitamin D (25 hydroxy)  Result Value Ref Range   Vit D, 25-Hydroxy 21.0 (L) 30.0 - 100.0 ng/mL      Assessment & Plan:   Problem List Items Addressed This Visit      Other   Vitamin D deficiency    Check vit D since it's a fat soluble vitamin      Relevant Orders   VITAMIN D 25 Hydroxy (Vit-D Deficiency, Fractures)   Irregular periods    Check LH and FSH and TSH      Relevant Orders   Luteinizing hormone   Follicle stimulating hormone   GAD (generalized anxiety disorder) - Primary    Continue SNRI; stop benzo      Relevant Medications   venlafaxine XR (EFFEXOR-XR) 75 MG 24 hr capsule   Fatigue    Check TSH      Relevant Orders   BASIC METABOLIC PANEL WITH GFR   CBC with Differential/Platelet   Elevated fasting lipid profile    Check fasting lipids, limit saturated fats      Relevant Orders   Lipid panel   Abnormal weight gain    Check TSH and add medicine if indicated      Relevant Orders   TSH   T4, free    Other Visit Diagnoses   None.     Follow up plan: Return in about 6 months (around 05/01/2016).  An after-visit summary was printed and given to the patient at [place].  Please see the patient instructions which may contain other information and recommendations beyond what is mentioned above in the assessment and plan.  Meds ordered this encounter  Medications  . venlafaxine XR (EFFEXOR-XR) 75 MG 24 hr capsule    Sig: Take 1 capsule (75 mg total) by mouth daily with breakfast.    Dispense:  90 capsule    Refill:  3    Orders Placed This Encounter  Procedures  . VITAMIN D 25 Hydroxy (Vit-D Deficiency, Fractures)  . Luteinizing hormone  . Follicle stimulating hormone  . TSH  . T4, free  . BASIC METABOLIC PANEL WITH GFR  . Lipid panel  . CBC with Differential/Platelet   She says she will get her flu shot at work

## 2015-11-02 NOTE — Assessment & Plan Note (Signed)
Continue SNRI; stop benzo

## 2015-11-02 NOTE — Patient Instructions (Addendum)
Check out the information at [email or web address].org entitled "Nutrition for Weight Loss: What You Need to Know about Fad Diets" Try to lose between 1-2 pounds per week by taking in fewer calories and burning off more calories You can succeed by limiting portions, limiting foods dense in calories and fat, becoming more active, and drinking 8 glasses of water a day (64 ounces) Don't skip meals, especially breakfast, as skipping meals may alter your metabolism Do not use over-the-counter weight loss pills or gimmicks that claim rapid weight loss A healthy BMI (or body mass index) is between 18.5 and 24.9 You can calculate your ideal BMI at the NIH website http://www.nhlbi.nih.gov/health/educational/lose_wt/BMI/bmicalc.htm Try to limit saturated fats in your diet (bologna, hot dogs, barbeque, cheeseburgers, hamburgers, steak, bacon, sausage, cheese, etc.) and get more fresh fruits, vegetables, and whole grains Let's get labs today If you have not heard anything from my staff in a week about any orders/referrals/studies from today, please contact us here to follow-up [phone]  

## 2015-11-02 NOTE — Assessment & Plan Note (Signed)
Check TSH 

## 2015-11-02 NOTE — Assessment & Plan Note (Signed)
Check LH and FSH and TSH

## 2015-11-03 LAB — FOLLICLE STIMULATING HORMONE: FSH: 4.4 m[IU]/mL

## 2015-11-03 LAB — LUTEINIZING HORMONE: LH: 5.2 m[IU]/mL

## 2015-11-03 LAB — VITAMIN D 25 HYDROXY (VIT D DEFICIENCY, FRACTURES): VIT D 25 HYDROXY: 51 ng/mL (ref 30–100)

## 2015-11-08 ENCOUNTER — Telehealth: Payer: Self-pay | Admitting: Family Medicine

## 2015-11-08 NOTE — Telephone Encounter (Signed)
Pt requesting results on lab work done last week. Please advise.

## 2015-11-09 ENCOUNTER — Encounter: Payer: Self-pay | Admitting: Family Medicine

## 2015-11-09 ENCOUNTER — Encounter: Payer: Self-pay | Admitting: Obstetrics and Gynecology

## 2015-11-09 NOTE — Telephone Encounter (Signed)
Patient notified

## 2015-11-09 NOTE — Telephone Encounter (Signed)
Please let patient know that her labs overall look very good Her female hormones show that she is NOT in menopause or perimenopause Her thyroid testing is normal Her vitamin D has really come up nicely, from 21 to 51 She can take 800 to 1000 iu of vitamin D3 once a day to keep that up; don't take more than 1000 iu daily though Her LDL has improved, dropping from 117 to 95; that's great

## 2015-11-24 ENCOUNTER — Other Ambulatory Visit: Payer: Self-pay | Admitting: Obstetrics and Gynecology

## 2015-11-24 DIAGNOSIS — N938 Other specified abnormal uterine and vaginal bleeding: Secondary | ICD-10-CM

## 2015-11-25 ENCOUNTER — Ambulatory Visit (INDEPENDENT_AMBULATORY_CARE_PROVIDER_SITE_OTHER): Payer: BC Managed Care – PPO | Admitting: Obstetrics and Gynecology

## 2015-11-25 ENCOUNTER — Ambulatory Visit (INDEPENDENT_AMBULATORY_CARE_PROVIDER_SITE_OTHER): Payer: BC Managed Care – PPO

## 2015-11-25 ENCOUNTER — Encounter: Payer: Self-pay | Admitting: Obstetrics and Gynecology

## 2015-11-25 VITALS — BP 144/96 | HR 88 | Ht 65.0 in | Wt 161.8 lb

## 2015-11-25 DIAGNOSIS — N938 Other specified abnormal uterine and vaginal bleeding: Secondary | ICD-10-CM

## 2015-11-25 DIAGNOSIS — N939 Abnormal uterine and vaginal bleeding, unspecified: Secondary | ICD-10-CM

## 2015-11-25 DIAGNOSIS — D259 Leiomyoma of uterus, unspecified: Secondary | ICD-10-CM

## 2015-11-25 NOTE — Progress Notes (Signed)
Subjective:     Patient ID: Dana Fitzpatrick, female   DOB: 1970-11-13, 45 y.o.   MRN: [id number]  HPI Please see MyChart messages- here for ultrasound for AUB x 2 months, reports moderate cramping with heavier bleeding and has been spotting x 10 days after last episode of menstrual like bleeding.   Review of Systems Negative except stated in HPI    Objective:   Physical Exam   A&O x4  well groomed female in no distress Blood pressure (!) 144/96, pulse 88, height 5\' 5"  (1.651 m), weight 161 lb 12.8 oz (73.4 kg), last menstrual period 11/02/2015.  Ultrasound findings: Indications:AUB Findings:  The uterus measures 8.2 x 4.5 x 5.2 cm. Echo texture is heterogenous with evidence of focal masses. Within the uterus are multiple suspected fibroids measuring: Fibroid 1: Anterior Lower Uterine Segment Subserosal fibroid measures 1.7 x 1.3 x 2.5 cm. Fibroid 2: Posterior Right Lower Uterine segment Subserosal Fibroid measures 1.6 x 1.5 x 1.5 cm. Fibroids do not appear to communicate with the endometrium.  The Endometrium appears thickened and measures 10 mm.  Right Ovary measures 2.7 x 1.2 x 1.9 cm. It is normal in appearance. Left Ovary measures 3.5 x 1.7 x 1.5 cm. It is normal appearance. Survey of the adnexa demonstrates no adnexal masses. There is no free fluid in the cul de sac.      Assessment:    Impression: 1. Fibroid uterus 2. Thickened Endometrium 3. AUB    Plan:    counseled on uterine fibroids and treatment options; is leaning towards Mirena or surgery. Will discuss with spouse and let me know.  RTC as needed.  >50 % of 15 minute visit spent reviewing u/s and counseling on above.  Joshuan Bolander Fairchild, CNM

## 2015-11-25 NOTE — Patient Instructions (Addendum)
Uterine Fibroids Uterine fibroids are tissue masses (tumors) that can develop in the womb (uterus). They are also called leiomyomas. This type of tumor is not cancerous (benign) and does not spread to other parts of the body outside of the pelvic area, which is between the hip bones. Occasionally, fibroids may develop in the fallopian tubes, in the cervix, or on the support structures (ligaments) that surround the uterus. You can have one or many fibroids. Fibroids can vary in size, weight, and where they grow in the uterus. Some can become quite large. Most fibroids do not require medical treatment. CAUSES A fibroid can develop when a single uterine cell keeps growing (replicating). Most cells in the human body have a control mechanism that keeps them from replicating without control. SIGNS AND SYMPTOMS Symptoms may include:   Heavy bleeding during your period.  Bleeding or spotting between periods.  Pelvic pain and pressure.  Bladder problems, such as needing to urinate more often (urinary frequency) or urgently.  Inability to reproduce offspring (infertility).  Miscarriages. DIAGNOSIS Uterine fibroids are diagnosed through a physical exam. Your health care provider may feel the lumpy tumors during a pelvic exam. Ultrasonography and an MRI may be done to determine the size, location, and number of fibroids. TREATMENT Treatment may include:  Watchful waiting. This involves getting the fibroid checked by your health care provider to see if it grows or shrinks. Follow your health care provider's recommendations for how often to have this checked.  Hormone medicines. These can be taken by mouth or given through an intrauterine device (IUD).  Surgery.  Removing the fibroids (myomectomy) or the uterus (hysterectomy).  Removing blood supply to the fibroids (uterine artery embolization). If fibroids interfere with your fertility and you want to become pregnant, your health care provider  may recommend having the fibroids removed.  HOME CARE INSTRUCTIONS  Keep all follow-up visits as directed by your health care provider. This is important.  Take medicines only as directed by your health care provider.  If you were prescribed a hormone treatment, take the hormone medicines exactly as directed.  Do not take aspirin, because it can cause bleeding.  Ask your health care provider about taking iron pills and increasing the amount of dark green, leafy vegetables in your diet. These actions can help to boost your blood iron levels, which may be affected by heavy menstrual bleeding.  Pay close attention to your period and tell your health care provider about any changes, such as:  Increased blood flow that requires you to use more pads or tampons than usual per month.  A change in the number of days that your period lasts per month.  A change in symptoms that are associated with your period, such as abdominal cramping or back pain. SEEK MEDICAL CARE IF:  You have pelvic pain, back pain, or abdominal cramps that cannot be controlled with medicines.  You have an increase in bleeding between and during periods.  You soak tampons or pads in a half hour or less.  You feel lightheaded, extra tired, or weak. SEEK IMMEDIATE MEDICAL CARE IF:  You faint.  You have a sudden increase in pelvic pain.   This information is not intended to replace advice given to you by your health care provider. Make sure you discuss any questions you have with your health care provider.   Document Released: 01/28/2000 Document Revised: 02/20/2014 Document Reviewed: 07/29/2013 Elsevier Interactive Patient Education 2016 [employer].   Levonorgestrel intrauterine device (IUD) What  is this medicine? LEVONORGESTREL IUD (LEE voe nor jes trel) is a contraceptive (birth control) device. The device is placed inside the uterus by a healthcare professional. It is used to prevent pregnancy and can also  be used to treat heavy bleeding that occurs during your period. Depending on the device, it can be used for 3 to 5 years. This medicine may be used for other purposes; ask your health care provider or pharmacist if you have questions. What should I tell my health care provider before I take this medicine? They need to know if you have any of these conditions: -abnormal Pap smear -cancer of the breast, uterus, or cervix -diabetes -endometritis -genital or pelvic infection now or in the past -have more than one sexual partner or your partner has more than one partner -heart disease -history of an ectopic or tubal pregnancy -immune system problems -IUD in place -liver disease or tumor -problems with blood clots or take blood-thinners -use intravenous drugs -uterus of unusual shape -vaginal bleeding that has not been explained -an unusual or allergic reaction to levonorgestrel, other hormones, silicone, or polyethylene, medicines, foods, dyes, or preservatives -pregnant or trying to get pregnant -breast-feeding How should I use this medicine? This device is placed inside the uterus by a health care professional. Talk to your pediatrician regarding the use of this medicine in children. Special care may be needed. Overdosage: If you think you have taken too much of this medicine contact a poison control center or emergency room at once. NOTE: This medicine is only for you. Do not share this medicine with others. What if I miss a dose? This does not apply. What may interact with this medicine? Do not take this medicine with any of the following medications: -amprenavir -bosentan -fosamprenavir This medicine may also interact with the following medications: -aprepitant -barbiturate medicines for inducing sleep or treating seizures -bexarotene -griseofulvin -medicines to treat seizures like carbamazepine, ethotoin, felbamate, oxcarbazepine, phenytoin,  topiramate -modafinil -pioglitazone -rifabutin -rifampin -rifapentine -some medicines to treat HIV infection like atazanavir, indinavir, lopinavir, nelfinavir, tipranavir, ritonavir -St. John's wort -warfarin This list may not describe all possible interactions. Give your health care provider a list of all the medicines, herbs, non-prescription drugs, or dietary supplements you use. Also tell them if you smoke, drink alcohol, or use illegal drugs. Some items may interact with your medicine. What should I watch for while using this medicine? Visit your doctor or health care professional for regular check ups. See your doctor if you or your partner has sexual contact with others, becomes HIV positive, or gets a sexual transmitted disease. This product does not protect you against HIV infection (AIDS) or other sexually transmitted diseases. You can check the placement of the IUD yourself by reaching up to the top of your vagina with clean fingers to feel the threads. Do not pull on the threads. It is a good habit to check placement after each menstrual period. Call your doctor right away if you feel more of the IUD than just the threads or if you cannot feel the threads at all. The IUD may come out by itself. You may become pregnant if the device comes out. If you notice that the IUD has come out use a backup birth control method like condoms and call your health care provider. Using tampons will not change the position of the IUD and are okay to use during your period. What side effects may I notice from receiving this medicine? Side effects that you  should report to your doctor or health care professional as soon as possible: -allergic reactions like skin rash, itching or hives, swelling of the face, lips, or tongue -fever, flu-like symptoms -genital sores -high blood pressure -no menstrual period for 6 weeks during use -pain, swelling, warmth in the leg -pelvic pain or tenderness -severe or  sudden headache -signs of pregnancy -stomach cramping -sudden shortness of breath -trouble with balance, talking, or walking -unusual vaginal bleeding, discharge -yellowing of the eyes or skin Side effects that usually do not require medical attention (report to your doctor or health care professional if they continue or are bothersome): -acne -breast pain -change in sex drive or performance -changes in weight -cramping, dizziness, or faintness while the device is being inserted -headache -irregular menstrual bleeding within first 3 to 6 months of use -nausea This list may not describe all possible side effects. Call your doctor for medical advice about side effects. You may report side effects to FDA at 1-800-FDA-1088. Where should I keep my medicine? This does not apply. NOTE: This sheet is a summary. It may not cover all possible information. If you have questions about this medicine, talk to your doctor, pharmacist, or health care provider.    2016, Elsevier/Gold Standard. (2011-03-02 13:54:04)    Myomectomy Myomectomy is surgery to remove a noncancerous tumor (myoma) from the uterus. Myomas are tumors made up of fibrous tissue. They are often called fibroid tumors. Fibroid tumors can range from the size of a pea to the size of a grapefruit. In a myomectomy, the fibroid tumor is removed without removing the uterus. Because these tumors are rarely cancerous, this surgery is usually done only if the tumor is growing or causing symptoms such as pain, pressure, bleeding, or pain with intercourse. LET [facility] CARE PROVIDER KNOW ABOUT:  Any allergies you have.  All medicines you are taking, including vitamins, herbs, eye drops, creams, and over-the-counter medicines.  Previous problems you or members of your family have had with the use of anesthetics.  Any blood disorders you have.  Previous surgeries you have had.  Medical conditions you have. RISKS AND COMPLICATIONS   Generally, this is a safe procedure. However, as with any procedure, complications can occur. Possible complications include:  Excessive bleeding.  Infection.  Injury to nearby organs.  Blood clots in the legs, chest, or brain.  Scar tissue on other organs and in the pelvis. This may require another surgery to remove the scar tissue. BEFORE THE PROCEDURE  Ask your health care provider about changing or stopping your regular medicines. Avoid taking aspirin or blood thinners as directed by your health care provider.  Do not  eat or drink anything after midnight on the night before surgery.  If you smoke, do not  smoke for 2 weeks before the surgery.  Do not  drink alcohol the day before the surgery.  Arrange for someone to drive you home after the procedure or after your hospital stay. Also arrange for someone to help you with activities during your recovery. PROCEDURE You will be given medicine to make you sleep through the procedure (general anesthetic). Any of the following methods may be used to perform a myomectomy:  Small monitors will be put on your body. They are used to check your heart, blood pressure, and oxygen level.  An IV access tube will be put into one of your veins. Medicine will be able to flow directly into your body through this IV tube.  You might be  given a medicine to help you relax (sedative).  You will be given a medicine to make you sleep (general anesthetic). A breathing tube will be placed into your lungs during the procedure.  A thin, flexible tube (catheter) will be inserted into your bladder to collect urine.  Any of the following methods may be used to perform a myomectomy:  Hysteroscopic myomectomy--This method may be used when the fibroid tumor is inside the cavity of the uterus. A long, thin tube that is like a telescope (hysteroscope) is inserted inside the uterus. A saline solution is put into your uterus. This expands the uterus and allows  the surgeon to see the fibroids. Tools are passed through the hysteroscope to remove the fibroid tumor in pieces.  Laparoscopic myomectomy--A few small cuts (incisions) are made in the lower abdomen. A thin, lighted tube with a tiny camera on the end (laparoscope) is inserted through one of the incisions. This gives the surgeon a good view of the area. The fibroid tumor is removed through the other incisions. The incisions are then closed with stitches (sutures) or staples.  Abdominal myomectomy--This method is used when the fibroid tumor cannot be removed with a hysteroscope or laparoscope. The surgery is performed through a larger surgical incision in the abdomen. The fibroid tumor is removed through this incision. The incision is closed with sutures or staples. AFTER THE PROCEDURE  If you had a laparoscopic or hysteroscopic myomectomy, you may be able to go home the same day, or you may need to stay in the hospital overnight.  If you had an abdominal myomectomy, you may need to stay in the hospital for a few days.  Your IV access tube and catheter will be removed in 1-2 days.  You may be given medicine for pain or to help you sleep.  You may be given an antibiotic medicine, if needed.   This information is not intended to replace advice given to you by your health care provider. Make sure you discuss any questions you have with your health care provider.   Document Released: 11/27/2006 Document Revised: 11/20/2012 Document Reviewed: 09/11/2012 Elsevier Interactive Patient Education 2016 [employer].  Hysterectomy Information  A hysterectomy is a surgery in which your uterus is removed. This surgery may be done to treat various medical problems. After the surgery, you will no longer have menstrual periods. The surgery will also make you unable to become pregnant (sterile). The fallopian tubes and ovaries can be removed (bilateral salpingo-oophorectomy) during this surgery as well.   REASONS FOR A HYSTERECTOMY  Persistent, abnormal bleeding.  Lasting (chronic) pelvic pain or infection.  The lining of the uterus (endometrium) starts growing outside the uterus (endometriosis).  The endometrium starts growing in the muscle of the uterus (adenomyosis).  The uterus falls down into the vagina (pelvic organ prolapse).  Noncancerous growths in the uterus (uterine fibroids) that cause symptoms.  Precancerous cells.  Cervical cancer or uterine cancer. TYPES OF HYSTERECTOMIES  Supracervical hysterectomy--In this type, the top part of the uterus is removed, but not the cervix.  Total hysterectomy--The uterus and cervix are removed.  Radical hysterectomy--The uterus, the cervix, and the fibrous tissue that holds the uterus in place in the pelvis (parametrium) are removed. WAYS A HYSTERECTOMY CAN BE PERFORMED  Abdominal hysterectomy--A large surgical cut (incision) is made in the abdomen. The uterus is removed through this incision.  Vaginal hysterectomy--An incision is made in the vagina. The uterus is removed through this incision. There are no abdominal  incisions.  Conventional laparoscopic hysterectomy--Three or four small incisions are made in the abdomen. A thin, lighted tube with a camera (laparoscope) is inserted into one of the incisions. Other tools are put through the other incisions. The uterus is cut into small pieces. The small pieces are removed through the incisions, or they are removed through the vagina.  Laparoscopically assisted vaginal hysterectomy (LAVH)--Three or four small incisions are made in the abdomen. Part of the surgery is performed laparoscopically and part vaginally. The uterus is removed through the vagina.  Robot-assisted laparoscopic hysterectomy--A laparoscope and other tools are inserted into 3 or 4 small incisions in the abdomen. A computer-controlled device is used to give the surgeon a 3D image and to help control the surgical  instruments. This allows for more precise movements of surgical instruments. The uterus is cut into small pieces and removed through the incisions or removed through the vagina. RISKS AND COMPLICATIONS  Possible complications associated with this procedure include:  Bleeding and risk of blood transfusion. Tell your health care provider if you do not want to receive any blood products.  Blood clots in the legs or lung.  Infection.  Injury to surrounding organs.  Problems or side effects related to anesthesia.  Conversion to an abdominal hysterectomy from one of the other techniques. WHAT TO EXPECT AFTER A HYSTERECTOMY  You will be given pain medicine.  You will need to have someone with you for the first 3-5 days after you go home.  You will need to follow up with your surgeon in 2-4 weeks after surgery to evaluate your progress.  You may have early menopause symptoms such as hot flashes, night sweats, and insomnia.  If you had a hysterectomy for a problem that was not cancer or not a condition that could lead to cancer, then you no longer need Pap tests. However, even if you no longer need a Pap test, a regular exam is a good idea to make sure no other problems are starting.   This information is not intended to replace advice given to you by your health care provider. Make sure you discuss any questions you have with your health care provider.   Document Released: 07/26/2000 Document Revised: 11/20/2012 Document Reviewed: 10/07/2012 Elsevier Interactive Patient Education [employer].

## 2015-11-26 ENCOUNTER — Encounter: Payer: Self-pay | Admitting: Obstetrics and Gynecology

## 2015-12-15 ENCOUNTER — Ambulatory Visit (INDEPENDENT_AMBULATORY_CARE_PROVIDER_SITE_OTHER): Payer: BC Managed Care – PPO | Admitting: Obstetrics and Gynecology

## 2015-12-15 ENCOUNTER — Encounter: Payer: Self-pay | Admitting: Obstetrics and Gynecology

## 2015-12-15 VITALS — BP 138/87 | HR 87 | Ht 65.0 in | Wt 162.3 lb

## 2015-12-15 DIAGNOSIS — N939 Abnormal uterine and vaginal bleeding, unspecified: Secondary | ICD-10-CM

## 2015-12-15 DIAGNOSIS — N882 Stricture and stenosis of cervix uteri: Secondary | ICD-10-CM

## 2015-12-15 DIAGNOSIS — D259 Leiomyoma of uterus, unspecified: Secondary | ICD-10-CM | POA: Diagnosis not present

## 2015-12-15 DIAGNOSIS — N921 Excessive and frequent menstruation with irregular cycle: Secondary | ICD-10-CM | POA: Diagnosis not present

## 2015-12-15 NOTE — Progress Notes (Signed)
GYN ENCOUNTER NOTE  Subjective:       Dana Fitzpatrick is a 45 y.o. G0P0000 female is here for gynecologic evaluation of the following issues:  1. Abnormal uterine bleeding- Starting in August pt did not have a menstrual cycle. Then she began to bleed heavily for a couple weeks, with one week spotting and then a few days of no bleeding. This cycle of abnormal bleeding has continued. Bleeding is heavy and clotted. Experiences occasional low back pain and bloating but no severe dysmenorrhea. Denies vasomotor symptoms and dyspareunia. She has not been treated conservatively for the bleeding and has not had an endometrial biopsy.   11/25/2015 ultrasound: Indications:AUB Findings:  The uterus measures 8.2 x 4.5 x 5.2 cm. Echo texture is heterogenous with evidence of focal masses. Within the uterus are multiple suspected fibroids measuring: Fibroid 1: Anterior Lower Uterine Segment Subserosal fibroid measures 1.7 x 1.3 x 2.5 cm. Fibroid 2: Posterior Right Lower Uterine segment Subserosal Fibroid measures 1.6 x 1.5 x 1.5 cm. Fibroids do not appear to communicate with the endometrium.  The Endometrium appears thickened and measures 10 mm.  Right Ovary measures 2.7 x 1.2 x 1.9 cm. It is normal in appearance. Left Ovary measures 3.5 x 1.7 x 1.5 cm. It is normal appearance. Survey of the adnexa demonstrates no adnexal masses. There is no free fluid in the cul de sac.  Impression: 1. Fibroid uterus 2. Thickened Endometrium  Recommendations: 1.Clinical correlation with the patient's History and Physical Exam.   Menstrual History Menarche- 45yo Interval- Prior to August: monthly Duration: Prior to August: 5 days Mild back cramps- treated with midol      Gynecologic History No LMP recorded (lmp unknown). Contraception: vasectomy   Obstetric History OB History  Gravida Para Term Preterm AB Living  0 0 0 0 0 0  SAB TAB Ectopic Multiple Live Births  0 0 0 0 0        Past  Medical History:  Diagnosis Date  . Acute superficial gastritis without hemorrhage   . GERD (gastroesophageal reflux disease)   . Mild exercise-induced asthma   . Muscular abdominal pain in left flank   . Musculoskeletal chest pain   . Other fatigue   . Unrefreshed by sleep     Past Surgical History:  Procedure Laterality Date  . COLONOSCOPY  2003   normal  . LAPAROSCOPY  2003  . TONSILLECTOMY      Current Outpatient Prescriptions on File Prior to Visit  Medication Sig Dispense Refill  . albuterol (VENTOLIN HFA) 108 (90 BASE) MCG/ACT inhaler Inhale 1-2 puffs into the lungs every 6 (six) hours as needed. 18 g 5  . Cetirizine HCl (ZYRTEC ALLERGY) 10 MG CAPS Take 1 capsule by mouth daily.    . cyclobenzaprine (FLEXERIL) 5 MG tablet Take 1-2 tablets by mouth at bedtime as needed.    . fluticasone (FLONASE) 50 MCG/ACT nasal spray Place 2 sprays into both nostrils daily. 16 g 1  . ibuprofen (ADVIL,MOTRIN) 800 MG tablet Take 1 tablet by mouth every 8 (eight) hours as needed.    . venlafaxine XR (EFFEXOR-XR) 75 MG 24 hr capsule Take 1 capsule (75 mg total) by mouth daily with breakfast. 90 capsule 3   No current facility-administered medications on file prior to visit.     No Known Allergies  Social History   Social History  . Marital status: Married    Spouse name: N/A  . Number of children: N/A  . Years of  education: N/A   Occupational History  . Not on file.   Social History Main Topics  . Smoking status: Never Smoker  . Smokeless tobacco: Never Used  . Alcohol use 0.0 oz/week     Comment: occasional  . Drug use: No  . Sexual activity: Yes    Partners: Male    Birth control/ protection: Other-see comments     Comment: vasectomy   Other Topics Concern  . Not on file   Social History Narrative  . No narrative on file    Family History  Problem Relation Age of Onset  . Heart disease Mother   . Dementia Mother   . Heart attack Father   . Breast cancer  Paternal Aunt 5  . Ovarian cancer Neg Hx   . Colon cancer Neg Hx   . Diabetes Neg Hx     The following portions of the patient's history were reviewed and updated as appropriate: allergies, current medications, past family history, past medical history, past social history, past surgical history and problem list.  Review of Systems Review of Systems - Per HPI  Objective:   BP 138/87   Pulse 87   Ht 5\' 5"  (1.651 m)   Wt 162 lb 4.8 oz (73.6 kg)   LMP  (LMP Unknown) Comment: spotting   BMI 27.01 kg/m  CONSTITUTIONAL: Well-developed, well-nourished female in no acute distress.  HENT:  Normocephalic, atraumatic.  NECK: Normal range of motion, supple, no masses.  Normal thyroid.  SKIN: Skin is warm and dry. No rash noted. Not diaphoretic. No erythema. No pallor. [place]: Alert and oriented to person, place, and time. PSYCHIATRIC: Normal mood and affect. Normal behavior. Normal judgment and thought content. CARDIOVASCULAR:Not Examined RESPIRATORY: Not Examined BREASTS: Not Examined ABDOMEN: Soft, non distended; Non tender.  No Organomegaly. PELVIC:  External Genitalia: Normal  BUS: Normal  Vagina: significant bleeding on exam including a clot  Cervix: Normal; stenotic requiring endocervical canal dilation with lacrimal duct probes  Uterus: anteverted uterus, top normal size, mobile, nontender  Adnexa: Normal; no masses, nontender  Rectovaginal: Normal external exam  PROCEDURE: Endometrial biopsy Endometrial Biopsy Procedure Note and endocervical canal dilation  Pre-operative Diagnosis: Abnormal uterine bleeding; menorrhagia  Post-operative Diagnosis: Same as above and cervical stenosis  Procedure Details   Urine pregnancy test was not done.  The risks (including infection, bleeding, pain, and uterine perforation) and benefits of the procedure were explained to the patient and Verbal informed consent was obtained.  Antibiotic prophylaxis against endocarditis was not  indicated.   The patient was placed in the dorsal lithotomy position.  Bimanual exam showed the uterus to be in the neutral position.  A Graves' speculum inserted in the vagina. The [place] could not be passed through the endocervical canal Endocervical curettage with a Kevorkian curette was not performed.   A sharp tenaculum was applied to the anterior lip of the cervix for stabilization. Initial uterine sound could not be placed through the endocervical canal. Lacrimal duct probes were used to dilate the cervix; after which, the uterine sound was placed to a level of 9 cm.  A Mylex 89mm curette was used to sample the endometrium.  Sample was sent for pathologic examination.  Condition: Stable  Complications: None  Plan:  The patient was advised to call for any fever or for prolonged or severe pain or bleeding. She was advised to use OTC acetaminophen and OTC ibuprofen as needed for mild to moderate pain. She was advised to avoid vaginal intercourse  for 48 hours or until the bleeding has completely stopped.  Attending Physician Documentation: Brayton Mars, MD       Assessment:  1. Abnormal Uterine Bleeding- unknown cause; endometrial biopsy to rule out pre-cancerous or cancerous etiology; discussed the treatment options if the endometrial biopsy is negative 2. Menorrhagia with irregular cycle 3. Cervical Stenosis- dilated for endometrial biopsy, could be due to fibroid location or nullparity 4. Uterine leiomyoma- 2 fibroids visualized on [place], both subserosal and less than 2.5cm    Plan:  1. Endocervical canal dilation and endometrial biopsy as per today 2. Hysteroscopy/D&C with NovaSure endometrial ablation is scheduled 3. Return week before surgery for preoperative appointment 4. Advil or Aleve is recommended for cramps after the procedure today   Alla German, PA-S Brayton Mars, MD   I have seen, interviewed, and examined the patient in conjunction with the  [facility].A. student and affirm the diagnosis and management plan. Martin A. DeFrancesco, MD, FACOG   Note: This dictation was prepared with Dragon dictation along with smaller phrase technology. Any transcriptional errors that result from this process are unintentional.

## 2015-12-15 NOTE — Patient Instructions (Signed)
1. Endocervical canal dilation and endometrial biopsy as per today 2. Hysteroscopy/D&C with NovaSure endometrial ablation is scheduled 3. Return week before surgery for preoperative appointment 4. Advil or Aleve is recommended for cramps after the procedure today     Endometrial Ablation Endometrial ablation removes the lining of the uterus (endometrium). It is usually a same-day, outpatient treatment. Ablation helps avoid major surgery, such as surgery to remove the cervix and uterus (hysterectomy). After endometrial ablation, you will have little or no menstrual bleeding and may not be able to have children. However, if you are premenopausal, you will need to use a reliable method of birth control following the procedure because of the small chance that pregnancy can occur. There are different reasons to have this procedure. These reasons include:  Heavy periods.  Bleeding that is causing anemia.  Irregular bleeding.  Bleeding fibroids on the lining inside the uterus if they are smaller than 3 centimeters. This procedure may not be possible for you if:   You want to have children in the future.   You have severe cramps with your menstrual period.   You have precancerous or cancerous cells in your uterus.   You were recently pregnant.   You have gone through menopause.   You have had major surgery on your uterus, resulting in thinning of the uterine wall. Surgeries may include:  The removal of one or more uterine fibroids (myomectomy).  A cesarean section with a classic (vertical) incision on your uterus. Ask your health care provider what type of cesarean you had. Sometimes the scar on your skin is different than the scar on your uterus. Even if you have had surgery on your uterus, certain types of ablation may still be safe for you. Talk with your health care provider. LET [facility] CARE PROVIDER KNOW ABOUT:  Any allergies you have.  All medicines you are taking,  including vitamins, herbs, eye drops, creams, and over-the-counter medicines.  Previous problems you or members of your family have had with the use of anesthetics.  Any blood disorders you have.  Previous surgeries you have had.  Medical conditions you have. RISKS AND COMPLICATIONS  Generally, this is a safe procedure. However, as with any procedure, complications can occur. Possible complications include:  Perforation of the uterus.  Bleeding.  Infection of the uterus, bladder, or vagina.  Injury to surrounding organs.  An air bubble to the lung (air embolus).  Pregnancy following the procedure.  Failure of the procedure to help the problem, requiring hysterectomy.  Decreased ability to diagnose cancer in the lining of the uterus. BEFORE THE PROCEDURE  The lining of the uterus must be tested to make sure there is no pre-cancerous or cancer cells present.  An ultrasound may be performed to look at the size of the uterus and to check for abnormalities.  Medicines may be given to thin the lining of the uterus. PROCEDURE  During the procedure, your health care provider will use a tool called a resectoscope to help see inside your uterus. There are different ways to remove the lining of your uterus.   Radiofrequency - This method uses a radiofrequency-alternating electric current to remove the lining of the uterus.  Cryotherapy - This method uses extreme cold to freeze the lining of the uterus.  Heated-Free Liquid - This method uses heated salt (saline) solution to remove the lining of the uterus.  Microwave - This method uses high-energy microwaves to heat up the lining of the uterus to  remove it.  Thermal balloon - This method involves inserting a catheter with a balloon tip into the uterus. The balloon tip is filled with heated fluid to remove the lining of the uterus. AFTER THE PROCEDURE  After your procedure, do not have sexual intercourse or insert anything into your  vagina until permitted by your health care provider. After the procedure, you may experience:  Cramps.  Vaginal discharge.  Frequent urination.   This information is not intended to replace advice given to you by your health care provider. Make sure you discuss any questions you have with your health care provider.   Document Released: 12/10/2003 Document Revised: 10/21/2014 Document Reviewed: 07/03/2012 Elsevier Interactive Patient Education 2016 Elsevier Inc[employer]     Endometrial Biopsy, Care After Refer to this sheet in the next few weeks. These instructions provide you with information on caring for yourself after your procedure. Your health care provider may also give you more specific instructions. Your treatment has been planned according to current medical practices, but problems sometimes occur. Call your health care provider if you have any problems or questions after your procedure. WHAT TO EXPECT AFTER THE PROCEDURE After your procedure, it is typical to have the following:  You may have mild cramping and a small amount of vaginal bleeding for a few days after the procedure. This is normal. HOME CARE INSTRUCTIONS  Only take over-the-counter or prescription medicine as directed by your health care provider.  Do not douche, use tampons, or have sexual intercourse until your health care provider approves.  Follow your health care provider's instructions regarding any activity restrictions, such as strenuous exercise or heavy lifting. SEEK MEDICAL CARE IF:  You have heavy bleeding or bleeding longer than 2 days after the procedure.  You have bad smelling drainage from your vagina.  You have a fever and chills.  Youhave severe lower stomach (abdominal) pain. SEEK IMMEDIATE MEDICAL CARE IF:  You have severe cramps in your stomach or back.  You pass large blood clots.  Your bleeding increases.  You become weak or lightheaded, or you pass out.   This information is  not intended to replace advice given to you by your health care provider. Make sure you discuss any questions you have with your health care provider.   Document Released: 11/20/2012 Document Reviewed: 11/20/2012 Elsevier Interactive Patient Education [employer].

## 2015-12-20 LAB — PATHOLOGY

## 2015-12-30 ENCOUNTER — Encounter: Payer: Self-pay | Admitting: Obstetrics and Gynecology

## 2015-12-30 ENCOUNTER — Encounter
Admission: RE | Admit: 2015-12-30 | Discharge: 2015-12-30 | Disposition: A | Discharge status: Home / Self Care | Payer: BC Managed Care – PPO | Source: Ambulatory Visit | Attending: Obstetrics and Gynecology | Admitting: Obstetrics and Gynecology

## 2015-12-30 ENCOUNTER — Ambulatory Visit (INDEPENDENT_AMBULATORY_CARE_PROVIDER_SITE_OTHER): Payer: BC Managed Care – PPO | Admitting: Obstetrics and Gynecology

## 2015-12-30 VITALS — BP 129/82 | HR 84 | Ht 65.0 in | Wt 163.9 lb

## 2015-12-30 DIAGNOSIS — Z01812 Encounter for preprocedural laboratory examination: Secondary | ICD-10-CM | POA: Insufficient documentation

## 2015-12-30 DIAGNOSIS — N939 Abnormal uterine and vaginal bleeding, unspecified: Secondary | ICD-10-CM

## 2015-12-30 DIAGNOSIS — D259 Leiomyoma of uterus, unspecified: Secondary | ICD-10-CM | POA: Diagnosis not present

## 2015-12-30 DIAGNOSIS — Z01818 Encounter for other preprocedural examination: Secondary | ICD-10-CM

## 2015-12-30 DIAGNOSIS — J45909 Unspecified asthma, uncomplicated: Secondary | ICD-10-CM | POA: Diagnosis not present

## 2015-12-30 DIAGNOSIS — Z0183 Encounter for blood typing: Secondary | ICD-10-CM | POA: Insufficient documentation

## 2015-12-30 DIAGNOSIS — N8501 Benign endometrial hyperplasia: Secondary | ICD-10-CM | POA: Insufficient documentation

## 2015-12-30 DIAGNOSIS — N920 Excessive and frequent menstruation with regular cycle: Secondary | ICD-10-CM | POA: Insufficient documentation

## 2015-12-30 DIAGNOSIS — N882 Stricture and stenosis of cervix uteri: Secondary | ICD-10-CM | POA: Insufficient documentation

## 2015-12-30 DIAGNOSIS — N921 Excessive and frequent menstruation with irregular cycle: Secondary | ICD-10-CM

## 2015-12-30 HISTORY — DX: Headache, unspecified: R51.9

## 2015-12-30 HISTORY — DX: Anxiety disorder, unspecified: F41.9

## 2015-12-30 HISTORY — DX: Headache: R51

## 2015-12-30 LAB — CBC WITH DIFFERENTIAL/PLATELET
BASOS PCT: 1 %
Basophils Absolute: 0.1 10*3/uL (ref 0–0.1)
Eosinophils Absolute: 0.2 10*3/uL (ref 0–0.7)
Eosinophils Relative: 3 %
HEMATOCRIT: 44 % (ref 35.0–47.0)
HEMOGLOBIN: 14.8 g/dL (ref 12.0–16.0)
LYMPHS ABS: 2 10*3/uL (ref 1.0–3.6)
Lymphocytes Relative: 28 %
MCH: 32.1 pg (ref 26.0–34.0)
MCHC: 33.7 g/dL (ref 32.0–36.0)
MCV: 95.4 fL (ref 80.0–100.0)
MONOS PCT: 5 %
Monocytes Absolute: 0.4 10*3/uL (ref 0.2–0.9)
NEUTROS ABS: 4.5 10*3/uL (ref 1.4–6.5)
NEUTROS PCT: 63 %
Platelets: 314 10*3/uL (ref 150–440)
RBC: 4.61 MIL/uL (ref 3.80–5.20)
RDW: 12.2 % (ref 11.5–14.5)
WBC: 7.2 10*3/uL (ref 3.6–11.0)

## 2015-12-30 LAB — TYPE AND SCREEN
ABO/RH(D): O POS
ANTIBODY SCREEN: NEGATIVE

## 2015-12-30 LAB — RAPID HIV SCREEN (HIV 1/2 AB+AG)
HIV 1/2 ANTIBODIES: NONREACTIVE
HIV-1 P24 Antigen - HIV24: NONREACTIVE

## 2015-12-30 NOTE — Progress Notes (Signed)
Subjective:  PREOPERATIVE HISTORY AND PHYSICAL    Date of surgery: 01/10/2016 Procedures: 1. Hysteroscopy/D&C 2. NovaSure endometrial ablation   Patient is a 45 y.o. G0P0010female scheduled for hysteroscopy/D&C with NovaSure endometrial ablation on 01/10/2016 for history of abnormal uterine bleeding, menorrhagia with irregular cycle. She does have 2 small subserosal fibroids, but an otherwise normal ultrasound. Findings:  The uterus measures 8.2 x 4.5 x 5.2 cm. Echo texture is heterogenous with evidence of focal masses. Within the uterus are multiple suspected fibroids measuring: Fibroid 1: Anterior Lower Uterine Segment Subserosal fibroid measures 1.7 x 1.3 x 2.5 cm. Fibroid 2: Posterior Right Lower Uterine segment Subserosal Fibroid measures 1.6 x 1.5 x 1.5 cm. Fibroids do not appear to communicate with the endometrium.  The Endometrium appears thickened and measures 10 mm.  Right Ovary measures 2.7 x 1.2 x 1.9 cm. It is normal in appearance. Left Ovary measures 3.5 x 1.7 x 1.5 cm. It is normal appearance. Survey of the adnexa demonstrates no adnexal masses. There is no free fluid in the cul de sac.    Abnormal uterine bleeding- Starting in August pt did not have a menstrual cycle. Then she began to bleed heavily for a couple weeks, with one week spotting and then a few days of no bleeding. This cycle of abnormal bleeding has continued. Bleeding is heavy and clotted. Experiences occasional low back pain and bloating but no severe dysmenorrhea. Denies vasomotor symptoms and dyspareunia. She has not been treated conservatively for the bleeding.  Preoperative endometrial biopsy was performed and demonstrated benign endometrial hyperplasia. Plan is to perform a frozen section following the D&C before proceeding with ablation.     Pertinent Gynecological History: No LMP recorded (lmp unknown). Contraception: vasectomy   Menstrual History: OB History    Gravida Para Term Preterm  AB Living   0 0 0 0 0 0   SAB TAB Ectopic Multiple Live Births   0 0 0 0 0      Menarche age: NA No LMP recorded (lmp unknown).    Past Medical History:  Diagnosis Date  . Acute superficial gastritis without hemorrhage   . GERD (gastroesophageal reflux disease)   . Mild exercise-induced asthma   . Muscular abdominal pain in left flank   . Musculoskeletal chest pain   . Other fatigue   . Unrefreshed by sleep     Past Surgical History:  Procedure Laterality Date  . COLONOSCOPY  2003   normal  . LAPAROSCOPY  2003  . TONSILLECTOMY      OB History  Gravida Para Term Preterm AB Living  0 0 0 0 0 0  SAB TAB Ectopic Multiple Live Births  0 0 0 0 0        Social History   Social History  . Marital status: Married    Spouse name: N/A  . Number of children: N/A  . Years of education: N/A   Social History Main Topics  . Smoking status: Never Smoker  . Smokeless tobacco: Never Used  . Alcohol use 0.0 oz/week     Comment: occasional  . Drug use: No  . Sexual activity: Yes    Partners: Male    Birth control/ protection: Other-see comments     Comment: vasectomy   Other Topics Concern  . None   Social History Narrative  . None    Family History  Problem Relation Age of Onset  . Heart disease Mother   . Dementia Mother   . Heart  attack Father   . Breast cancer Paternal Aunt 13  . Ovarian cancer Neg Hx   . Colon cancer Neg Hx   . Diabetes Neg Hx      (Not in a hospital admission)  No Known Allergies  Review of Systems Constitutional: No recent fever/chills/sweats Respiratory: No recent cough/bronchitis Cardiovascular: No chest pain Gastrointestinal: No recent nausea/vomiting/diarrhea Genitourinary: No UTI symptoms Hematologic/lymphatic:No history of coagulopathy or recent blood thinner use    Objective:    BP 129/82   Pulse 84   Ht 5\' 5"  (1.651 m)   Wt 163 lb 14.4 oz (74.3 kg)   LMP  (LMP Unknown) Comment: spotting   BMI 27.27 kg/m    General:   Normal  Skin:   normal  HEENT:  Normal  Neck:  Supple without Adenopathy or Thyromegaly  Lungs:   Heart:              Breasts:   Abdomen:  Pelvis:  M/S   Extremeties:  Neuro:    clear to auscultation bilaterally   Normal without murmur   Not Examined   soft, non-tender; bowel sounds normal; no masses,  no organomegaly   Exam deferred to OR  No CVAT  Warm/Dry   Normal       12/15/2015 PELVIC:             External Genitalia: Normal             BUS: Normal             Vagina: significant bleeding on exam including a clot             Cervix: Normal; stenotic requiring endocervical canal dilation with lacrimal duct probes             Uterus: anteverted uterus, top normal size, mobile, nontender             Adnexa: Normal; no masses, nontender             Rectovaginal: Normal external exam   Assessment:    1. Abnormal uterine bleeding/menorrhagia 2. Benign endometrial ablation on endometrial biopsy 3. Small subserosal fibroids   Plan:  1. Hysteroscopy/D&C 2. Frozen section prior to endometrial ablation 3. NovaSure endometrial ablation  Preop counseling: Patient is to undergo hysteroscopy/D&C with NovaSure endometrial ablation on 01/10/2016. She is understanding of the planned procedures and is aware of and is accepting of all surgical risks which include but are not limited to bleeding, infection, pelvic organ injury with need for repair, blood clot disorders, anesthesia risks, etc. All questions have been answered. Informed consent is given. Patient is ready and willing to proceed with surgery as scheduled. Frozen section following D&C will be performed to rule out any significant pathology which would preclude NovaSure endometrial ablation.  Brayton Mars, MD  Note: This dictation was prepared with Dragon dictation along with smaller phrase technology. Any transcriptional errors that result from this process are unintentional.

## 2015-12-30 NOTE — Patient Instructions (Signed)
1. Return in 1 week after surgery for postop check 

## 2015-12-30 NOTE — Patient Instructions (Signed)
  Your procedure is scheduled [id number] Nov. 27 , 2017. Report to Same Day Surgery. To find out your arrival time please call [phone] between 1PM - 3PM on Friday Nov. 24, 2017.  Remember: Instructions that are not followed completely may result in serious medical risk, up to and including death, or upon the discretion of your surgeon and anesthesiologist your surgery may need to be rescheduled.    _x___ 1. Do not eat food or drink liquids after midnight. No gum chewing or hard candies.     ____ 2. No Alcohol for 24 hours before or after surgery.   ____ 3. Bring all medications with you on the day of surgery if instructed.    __x__ 4. Notify your doctor if there is any change in your medical condition     (cold, fever, infections).    _____ 5. No smoking 24 hours prior to surgery.     Do not wear jewelry, make-up, hairpins, clips or nail polish.  Do not wear lotions, powders, or perfumes.   Do not shave 48 hours prior to surgery. Men may shave face and neck.  Do not bring valuables to the hospital.    [facility] is not responsible for any belongings or valuables.               Contacts, dentures or bridgework may not be worn into surgery.  Leave your suitcase in the car. After surgery it may be brought to your room.  For patients admitted to the hospital, discharge time is determined by your treatment team.   Patients discharged the day of surgery will not be allowed to drive home.    Please read over the following fact sheets that you were given:   [facility] Preparing for Surgery  ____ Take these medicines the morning of surgery with A SIP OF WATER:    1. venlafaxine XR (EFFEXOR-XR)   ____ Fleet Enema (as directed)   ____ Use CHG Soap as directed on instruction sheet  _x___ Use inhalers on the day of surgery and bring to hospital day of surgery  ____ Stop metformin 2 days prior to surgery    ____ Take 1/2 of usual insulin dose the night before surgery and none  on the morning of surgery.   ____ Stop Coumadin/Plavix/aspirin on does not apply.  _x___ Stop Anti-inflammatories such as Advil, Aleve, Ibuprofen, Motrin, Naproxen, Naprosyn, Goodies powders or aspirin products. OK to take Tylenol.   ____ Stop supplements until after surgery.    ____ Bring C-Pap to the hospital.

## 2015-12-30 NOTE — H&P (Signed)
Subjective:  PREOPERATIVE HISTORY AND PHYSICAL    Date of surgery: 01/10/2016 Procedures: 1. Hysteroscopy/D&C 2. NovaSure endometrial ablation   Patient is a 45 y.o. [id number] scheduled for hysteroscopy/D&C with NovaSure endometrial ablation on 01/10/2016 for history of abnormal uterine bleeding, menorrhagia with irregular cycle. She does have 2 small subserosal fibroids, but an otherwise normal ultrasound. Findings:  The uterus measures 8.2 x 4.5 x 5.2 cm. Echo texture is heterogenous with evidence of focal masses. Within the uterus are multiple suspected fibroids measuring: Fibroid 1: Anterior Lower Uterine Segment Subserosal fibroid measures 1.7 x 1.3 x 2.5 cm. Fibroid 2: Posterior Right Lower Uterine segment Subserosal Fibroid measures 1.6 x 1.5 x 1.5 cm. Fibroids do not appear to communicate with the endometrium.  The Endometrium appears thickened and measures 10 mm.  Right Ovary measures 2.7 x 1.2 x 1.9 cm. It is normal in appearance. Left Ovary measures 3.5 x 1.7 x 1.5 cm. It is normal appearance. Survey of the adnexa demonstrates no adnexal masses. There is no free fluid in the cul de sac.    Abnormal uterine bleeding- Starting in August pt did not have a menstrual cycle. Then she began to bleed heavily for a couple weeks, with one week spotting and then a few days of no bleeding. This cycle of abnormal bleeding has continued. Bleeding is heavy and clotted. Experiences occasional low back pain and bloating but no severe dysmenorrhea. Denies vasomotor symptoms and dyspareunia. She has not been treated conservatively for the bleeding.  Preoperative endometrial biopsy was performed and demonstrated benign endometrial hyperplasia. Plan is to perform a frozen section following the D&C before proceeding with ablation.     Pertinent Gynecological History: No LMP recorded (lmp unknown). Contraception: vasectomy   Menstrual History: OB History    Gravida Para Term Preterm  AB Living   0 0 0 0 0 0   SAB TAB Ectopic Multiple Live Births   0 0 0 0 0      Menarche age: NA No LMP recorded (lmp unknown).    Past Medical History:  Diagnosis Date  . Acute superficial gastritis without hemorrhage   . GERD (gastroesophageal reflux disease)   . Mild exercise-induced asthma   . Muscular abdominal pain in left flank   . Musculoskeletal chest pain   . Other fatigue   . Unrefreshed by sleep     Past Surgical History:  Procedure Laterality Date  . COLONOSCOPY  2003   normal  . LAPAROSCOPY  2003  . TONSILLECTOMY      OB History  Gravida Para Term Preterm AB Living  0 0 0 0 0 0  SAB TAB Ectopic Multiple Live Births  0 0 0 0 0        Social History   Social History  . Marital status: Married    Spouse name: N/A  . Number of children: N/A  . Years of education: N/A   Social History Main Topics  . Smoking status: Never Smoker  . Smokeless tobacco: Never Used  . Alcohol use 0.0 oz/week     Comment: occasional  . Drug use: No  . Sexual activity: Yes    Partners: Male    Birth control/ protection: Other-see comments     Comment: vasectomy   Other Topics Concern  . None   Social History Narrative  . None    Family History  Problem Relation Age of Onset  . Heart disease Mother   . Dementia Mother   . Heart  attack Father   . Breast cancer Paternal Aunt 71  . Ovarian cancer Neg Hx   . Colon cancer Neg Hx   . Diabetes Neg Hx      (Not in a hospital admission)  No Known Allergies  Review of Systems Constitutional: No recent fever/chills/sweats Respiratory: No recent cough/bronchitis Cardiovascular: No chest pain Gastrointestinal: No recent nausea/vomiting/diarrhea Genitourinary: No UTI symptoms Hematologic/lymphatic:No history of coagulopathy or recent blood thinner use    Objective:    BP 129/82   Pulse 84   Ht 5\' 5"  (1.651 m)   Wt 163 lb 14.4 oz (74.3 kg)   LMP  (LMP Unknown) Comment: spotting   BMI 27.27 kg/m    General:   Normal  Skin:   normal  HEENT:  Normal  Neck:  Supple without Adenopathy or Thyromegaly  Lungs:   Heart:              Breasts:   Abdomen:  Pelvis:  M/S   Extremeties:  Neuro:    clear to auscultation bilaterally   Normal without murmur   Not Examined   soft, non-tender; bowel sounds normal; no masses,  no organomegaly   Exam deferred to OR  No CVAT  Warm/Dry   Normal       12/15/2015 PELVIC:             External Genitalia: Normal             BUS: Normal             Vagina: significant bleeding on exam including a clot             Cervix: Normal; stenotic requiring endocervical canal dilation with lacrimal duct probes             Uterus: anteverted uterus, top normal size, mobile, nontender             Adnexa: Normal; no masses, nontender             Rectovaginal: Normal external exam   Assessment:    1. Abnormal uterine bleeding/menorrhagia 2. Benign endometrial ablation on endometrial biopsy 3. Small subserosal fibroids   Plan:  1. Hysteroscopy/D&C 2. Frozen section prior to endometrial ablation 3. NovaSure endometrial ablation  Preop counseling: Patient is to undergo hysteroscopy/D&C with NovaSure endometrial ablation on 01/10/2016. She is understanding of the planned procedures and is aware of and is accepting of all surgical risks which include but are not limited to bleeding, infection, pelvic organ injury with need for repair, blood clot disorders, anesthesia risks, etc. All questions have been answered. Informed consent is given. Patient is ready and willing to proceed with surgery as scheduled. Frozen section following D&C will be performed to rule out any significant pathology which would preclude NovaSure endometrial ablation.  Brayton Mars, MD  Note: This dictation was prepared with Dragon dictation along with smaller phrase technology. Any transcriptional errors that result from this process are unintentional.

## 2015-12-30 NOTE — Pre-Procedure Instructions (Deleted)
Spoke with Margaretha Sheffield Dr. Clydell Hakim nurse regarding pre-op order of Tranexamic Acid and pt's history of CVA being a possible contra indication for giving this med.  Margaretha Sheffield reported she will review this will Dr. Marry Guan to see if want to proceed with this med.

## 2015-12-31 LAB — RPR: RPR Ser Ql: NONREACTIVE

## 2016-01-10 ENCOUNTER — Ambulatory Visit: Payer: BC Managed Care – PPO | Admitting: Anesthesiology

## 2016-01-10 ENCOUNTER — Ambulatory Visit
Admission: RE | Admit: 2016-01-10 | Discharge: 2016-01-10 | Disposition: A | Discharge status: Home / Self Care | Payer: BC Managed Care – PPO | Source: Ambulatory Visit | Attending: Obstetrics and Gynecology | Admitting: Obstetrics and Gynecology

## 2016-01-10 ENCOUNTER — Encounter
Admission: RE | Disposition: A | Discharge status: Home / Self Care | Payer: Self-pay | Source: Ambulatory Visit | Attending: Obstetrics and Gynecology

## 2016-01-10 ENCOUNTER — Encounter: Payer: Self-pay | Admitting: *Deleted

## 2016-01-10 DIAGNOSIS — N882 Stricture and stenosis of cervix uteri: Secondary | ICD-10-CM | POA: Diagnosis not present

## 2016-01-10 DIAGNOSIS — Z9889 Other specified postprocedural states: Secondary | ICD-10-CM

## 2016-01-10 DIAGNOSIS — N8501 Benign endometrial hyperplasia: Secondary | ICD-10-CM | POA: Diagnosis not present

## 2016-01-10 DIAGNOSIS — N920 Excessive and frequent menstruation with regular cycle: Secondary | ICD-10-CM | POA: Insufficient documentation

## 2016-01-10 DIAGNOSIS — D252 Subserosal leiomyoma of uterus: Secondary | ICD-10-CM | POA: Insufficient documentation

## 2016-01-10 DIAGNOSIS — K219 Gastro-esophageal reflux disease without esophagitis: Secondary | ICD-10-CM | POA: Diagnosis not present

## 2016-01-10 DIAGNOSIS — D259 Leiomyoma of uterus, unspecified: Secondary | ICD-10-CM

## 2016-01-10 DIAGNOSIS — N939 Abnormal uterine and vaginal bleeding, unspecified: Secondary | ICD-10-CM

## 2016-01-10 DIAGNOSIS — J45909 Unspecified asthma, uncomplicated: Secondary | ICD-10-CM | POA: Insufficient documentation

## 2016-01-10 DIAGNOSIS — F419 Anxiety disorder, unspecified: Secondary | ICD-10-CM | POA: Insufficient documentation

## 2016-01-10 DIAGNOSIS — N921 Excessive and frequent menstruation with irregular cycle: Secondary | ICD-10-CM | POA: Diagnosis not present

## 2016-01-10 HISTORY — PX: DILITATION & CURRETTAGE/HYSTROSCOPY WITH NOVASURE ABLATION: SHX5568

## 2016-01-10 LAB — POCT PREGNANCY, URINE: PREG TEST UR: NEGATIVE

## 2016-01-10 LAB — ABO/RH: ABO/RH(D): O POS

## 2016-01-10 SURGERY — DILATATION & CURETTAGE/HYSTEROSCOPY WITH NOVASURE ABLATION
Anesthesia: General | Site: Vagina | Wound class: Clean Contaminated

## 2016-01-10 MED ORDER — OXYCODONE HCL 5 MG/5ML PO SOLN: 5.0000 mg | Freq: Once | ORAL | Status: DC | PRN

## 2016-01-10 MED ORDER — OXYCODONE-ACETAMINOPHEN 5-325 MG PO TABS: 1.0000 | ORAL_TABLET | ORAL | 0 refills | Status: DC | PRN

## 2016-01-10 MED ORDER — MIDAZOLAM HCL 2 MG/2ML IJ SOLN
INTRAMUSCULAR | Status: DC | PRN
Start: 2016-01-10 — End: 2016-01-10
  Administered 2016-01-10: 2 mg via INTRAVENOUS

## 2016-01-10 MED ORDER — EPHEDRINE SULFATE 50 MG/ML IJ SOLN
INTRAMUSCULAR | Status: DC | PRN
  Administered 2016-01-10: 10 mg via INTRAVENOUS

## 2016-01-10 MED ORDER — FENTANYL CITRATE (PF) 100 MCG/2ML IJ SOLN
INTRAMUSCULAR | Status: DC | PRN
  Administered 2016-01-10 (×2): 50 ug via INTRAVENOUS

## 2016-01-10 MED ORDER — FENTANYL CITRATE (PF) 100 MCG/2ML IJ SOLN: 25.0000 ug | INTRAMUSCULAR | Status: DC | PRN

## 2016-01-10 MED ORDER — DEXAMETHASONE SODIUM PHOSPHATE 10 MG/ML IJ SOLN
INTRAMUSCULAR | Status: DC | PRN
  Administered 2016-01-10: 4 mg via INTRAVENOUS

## 2016-01-10 MED ORDER — LACTATED RINGERS IV SOLN
INTRAVENOUS | Status: DC
  Administered 2016-01-10: 09:00:00 via INTRAVENOUS

## 2016-01-10 MED ORDER — PROPOFOL 10 MG/ML IV BOLUS
INTRAVENOUS | Status: DC | PRN
  Administered 2016-01-10: 50 mg via INTRAVENOUS
  Administered 2016-01-10: 200 mg via INTRAVENOUS

## 2016-01-10 MED ORDER — ONDANSETRON HCL 4 MG/2ML IJ SOLN
INTRAMUSCULAR | Status: DC | PRN
  Administered 2016-01-10: 4 mg via INTRAVENOUS

## 2016-01-10 MED ORDER — LACTATED RINGERS IV SOLN
INTRAVENOUS | Status: DC | PRN
  Administered 2016-01-10: 11:00:00 via INTRAVENOUS

## 2016-01-10 MED ORDER — FAMOTIDINE 20 MG PO TABS
ORAL_TABLET | ORAL | Status: AC
  Filled 2016-01-10: qty 1

## 2016-01-10 MED ORDER — OXYCODONE HCL 5 MG PO TABS: 5.0000 mg | ORAL_TABLET | Freq: Once | ORAL | Status: DC | PRN

## 2016-01-10 MED ORDER — FAMOTIDINE 20 MG PO TABS
20.0000 mg | ORAL_TABLET | Freq: Once | ORAL | Status: AC
  Administered 2016-01-10: 20 mg via ORAL

## 2016-01-10 SURGICAL SUPPLY — 12 items
CATH ROBINSON RED A/P 16FR (CATHETERS) ×3 IMPLANT
GLOVE BIO SURGEON STRL SZ8 (GLOVE) ×3 IMPLANT
GOWN STRL REUS W/ TWL LRG LVL3 (GOWN DISPOSABLE) ×1 IMPLANT
GOWN STRL REUS W/ TWL XL LVL3 (GOWN DISPOSABLE) ×1 IMPLANT
GOWN STRL REUS W/TWL LRG LVL3 (GOWN DISPOSABLE) ×2
GOWN STRL REUS W/TWL XL LVL3 (GOWN DISPOSABLE) ×2
IV LACTATED RINGERS 1000ML (IV SOLUTION) ×3 IMPLANT
KIT RM TURNOVER CYSTO AR (KITS) ×3 IMPLANT
NOVASURE ENDOMETRIAL ABLATION (MISCELLANEOUS) ×3 IMPLANT
PACK DNC HYST (MISCELLANEOUS) ×3 IMPLANT
PAD OB MATERNITY 4.3X12.25 (PERSONAL CARE ITEMS) ×3 IMPLANT
PAD PREP 24X41 OB/GYN DISP (PERSONAL CARE ITEMS) ×3 IMPLANT

## 2016-01-10 NOTE — Anesthesia Postprocedure Evaluation (Signed)
Anesthesia Post Note  Patient: Dana Fitzpatrick  Procedure(s) Performed: Procedure(s) (LRB): DILATATION & CURETTAGE/HYSTEROSCOPY WITH NOVASURE ABLATION (N/A)  Patient location during evaluation: PACU Anesthesia Type: General Level of consciousness: awake and alert Pain management: pain level controlled Vital Signs Assessment: post-procedure vital signs reviewed and stable Respiratory status: spontaneous breathing, nonlabored ventilation, respiratory function stable and patient connected to nasal cannula oxygen Cardiovascular status: blood pressure returned to baseline and stable Postop Assessment: no signs of nausea or vomiting Anesthetic complications: no    Last Vitals:  Vitals:   01/10/16 1246 01/10/16 1255  BP:  (!) 144/83  Pulse: 93 94  Resp: 10 12  Temp:  36.8 C    Last Pain:  Vitals:   01/10/16 1255  TempSrc: Temporal  PainSc:                  Precious Haws Piscitello

## 2016-01-10 NOTE — H&P (View-Only) (Signed)
Subjective:  PREOPERATIVE HISTORY AND PHYSICAL    Date of surgery: 01/10/2016 Procedures: 1. Hysteroscopy/D&C 2. NovaSure endometrial ablation   Patient is a 45 y.o. [id number] scheduled for hysteroscopy/D&C with NovaSure endometrial ablation on 01/10/2016 for history of abnormal uterine bleeding, menorrhagia with irregular cycle. She does have 2 small subserosal fibroids, but an otherwise normal ultrasound. Findings:  The uterus measures 8.2 x 4.5 x 5.2 cm. Echo texture is heterogenous with evidence of focal masses. Within the uterus are multiple suspected fibroids measuring: Fibroid 1: Anterior Lower Uterine Segment Subserosal fibroid measures 1.7 x 1.3 x 2.5 cm. Fibroid 2: Posterior Right Lower Uterine segment Subserosal Fibroid measures 1.6 x 1.5 x 1.5 cm. Fibroids do not appear to communicate with the endometrium.  The Endometrium appears thickened and measures 10 mm.  Right Ovary measures 2.7 x 1.2 x 1.9 cm. It is normal in appearance. Left Ovary measures 3.5 x 1.7 x 1.5 cm. It is normal appearance. Survey of the adnexa demonstrates no adnexal masses. There is no free fluid in the cul de sac.    Abnormal uterine bleeding- Starting in August pt did not have a menstrual cycle. Then she began to bleed heavily for a couple weeks, with one week spotting and then a few days of no bleeding. This cycle of abnormal bleeding has continued. Bleeding is heavy and clotted. Experiences occasional low back pain and bloating but no severe dysmenorrhea. Denies vasomotor symptoms and dyspareunia. She has not been treated conservatively for the bleeding.  Preoperative endometrial biopsy was performed and demonstrated benign endometrial hyperplasia. Plan is to perform a frozen section following the D&C before proceeding with ablation.     Pertinent Gynecological History: No LMP recorded (lmp unknown). Contraception: vasectomy   Menstrual History: OB History    Gravida Para Term Preterm  AB Living   0 0 0 0 0 0   SAB TAB Ectopic Multiple Live Births   0 0 0 0 0      Menarche age: NA No LMP recorded (lmp unknown).    Past Medical History:  Diagnosis Date  . Acute superficial gastritis without hemorrhage   . GERD (gastroesophageal reflux disease)   . Mild exercise-induced asthma   . Muscular abdominal pain in left flank   . Musculoskeletal chest pain   . Other fatigue   . Unrefreshed by sleep     Past Surgical History:  Procedure Laterality Date  . COLONOSCOPY  2003   normal  . LAPAROSCOPY  2003  . TONSILLECTOMY      OB History  Gravida Para Term Preterm AB Living  0 0 0 0 0 0  SAB TAB Ectopic Multiple Live Births  0 0 0 0 0        Social History   Social History  . Marital status: Married    Spouse name: N/A  . Number of children: N/A  . Years of education: N/A   Social History Main Topics  . Smoking status: Never Smoker  . Smokeless tobacco: Never Used  . Alcohol use 0.0 oz/week     Comment: occasional  . Drug use: No  . Sexual activity: Yes    Partners: Male    Birth control/ protection: Other-see comments     Comment: vasectomy   Other Topics Concern  . None   Social History Narrative  . None    Family History  Problem Relation Age of Onset  . Heart disease Mother   . Dementia Mother   . Heart  attack Father   . Breast cancer Paternal Aunt 81  . Ovarian cancer Neg Hx   . Colon cancer Neg Hx   . Diabetes Neg Hx      (Not in a hospital admission)  No Known Allergies  Review of Systems Constitutional: No recent fever/chills/sweats Respiratory: No recent cough/bronchitis Cardiovascular: No chest pain Gastrointestinal: No recent nausea/vomiting/diarrhea Genitourinary: No UTI symptoms Hematologic/lymphatic:No history of coagulopathy or recent blood thinner use    Objective:    BP 129/82   Pulse 84   Ht 5\' 5"  (1.651 m)   Wt 163 lb 14.4 oz (74.3 kg)   LMP  (LMP Unknown) Comment: spotting   BMI 27.27 kg/m    General:   Normal  Skin:   normal  HEENT:  Normal  Neck:  Supple without Adenopathy or Thyromegaly  Lungs:   Heart:              Breasts:   Abdomen:  Pelvis:  M/S   Extremeties:  Neuro:    clear to auscultation bilaterally   Normal without murmur   Not Examined   soft, non-tender; bowel sounds normal; no masses,  no organomegaly   Exam deferred to OR  No CVAT  Warm/Dry   Normal       12/15/2015 PELVIC:             External Genitalia: Normal             BUS: Normal             Vagina: significant bleeding on exam including a clot             Cervix: Normal; stenotic requiring endocervical canal dilation with lacrimal duct probes             Uterus: anteverted uterus, top normal size, mobile, nontender             Adnexa: Normal; no masses, nontender             Rectovaginal: Normal external exam   Assessment:    1. Abnormal uterine bleeding/menorrhagia 2. Benign endometrial ablation on endometrial biopsy 3. Small subserosal fibroids   Plan:  1. Hysteroscopy/D&C 2. Frozen section prior to endometrial ablation 3. NovaSure endometrial ablation  Preop counseling: Patient is to undergo hysteroscopy/D&C with NovaSure endometrial ablation on 01/10/2016. She is understanding of the planned procedures and is aware of and is accepting of all surgical risks which include but are not limited to bleeding, infection, pelvic organ injury with need for repair, blood clot disorders, anesthesia risks, etc. All questions have been answered. Informed consent is given. Patient is ready and willing to proceed with surgery as scheduled. Frozen section following D&C will be performed to rule out any significant pathology which would preclude NovaSure endometrial ablation.  Brayton Mars, MD  Note: This dictation was prepared with Dragon dictation along with smaller phrase technology. Any transcriptional errors that result from this process are unintentional.

## 2016-01-10 NOTE — Discharge Instructions (Signed)

## 2016-01-10 NOTE — Interval H&P Note (Signed)
History and Physical Interval Note:  01/10/2016 10:31 AM  Dana Fitzpatrick  has presented today for surgery, with the diagnosis of AUB,MENORRHAGIA, CERVICAL STENOSIS, UTERINE LEIOMYOMA  The various methods of treatment have been discussed with the patient and family. After consideration of risks, benefits and other options for treatment, the patient has consented to  Procedure(s): [place] (N/A) as a surgical intervention .  The patient's history has been reviewed, patient examined, no change in status, stable for surgery.  I have reviewed the patient's chart and labs.  Questions were answered to the patient's satisfaction.     Hassell Done A [name]

## 2016-01-10 NOTE — Transfer of Care (Signed)
Immediate Anesthesia Transfer of Care Note  Patient: Dana Fitzpatrick  Procedure(s) Performed: Procedure(s): DILATATION & CURETTAGE/HYSTEROSCOPY WITH NOVASURE ABLATION (N/A)  Patient Location: PACU  Anesthesia Type:General  Level of Consciousness: alert   Airway & Oxygen Therapy: Patient Spontanous Breathing  Post-op Assessment: Report given to RN  Post vital signs: Reviewed Stable and reviewed Last Vitals:  Vitals:   01/10/16 0827  BP: (!) 138/92  Pulse: 89  Resp: 16  Temp: 36.9 C    Last Pain:  Vitals:   01/10/16 0827  TempSrc: Oral         Complications: No apparent anesthesia complications

## 2016-01-10 NOTE — Anesthesia Procedure Notes (Signed)
Procedure Name: LMA Insertion Date/Time: 01/10/2016 12:16 PM Performed by: Timoteo Expose Pre-anesthesia Checklist: Patient being monitored, Emergency Drugs available and Suction available Oxygen Delivery Method: Circle system utilized Preoxygenation: Pre-oxygenation with 100% oxygen Intubation Type: IV induction Ventilation: Mask ventilation without difficulty LMA: LMA inserted LMA Size: 4.0

## 2016-01-10 NOTE — Op Note (Signed)
OPERATIVE NOTE  Date of surgery: 01/10/2016  Preoperative diagnosis:  1. Abnormal uterine bleeding 2. Endometrial hyperplasia, simple  Postoperative diagnosis:  1. Abnormal uterine bleeding 2. Endometrial hyperplasia, simple 3. Frozen section consistent with benign endometrium without atypia  Operative procedure: 1. Hysteroscopy 2. Dilation and curettage of endometrium with frozen section 3. NovaSure endometrial ablation  Surgeon: Dr. Zipporah Plants Assistant: None Anesthesia: Gen.   Findings: Pelvis: Gynecoid Uterus: sounded to 9.5 cm; cervical length 4 cm; cavity width 2.7 cm Normal appearing endometrial cavity without gross lesions.  Description of procedure Patient was brought to the operating room where she was placed in supine position. General anesthesia was induced without difficulty. The patient was placed in dorsal lithotomy position using candy cane stirrups. A betadine perineal intravaginal prep and drape was performed in standard fashion. Weighted speculum was placed in the vagina. Single-tooth tenaculum was placed on the anterior cervix. Uterus was sounded to 9.5 cm. Hegar dilators were used up to a #8 [place] caliber to dilate the endocervical canal. The [facility] hysteroscope using lactated Ringer's as irrigant was used for the hysteroscopy. The above-noted findings were photo documented. The hysteroscope removed. The smooth and serrated curettes were then used to curettage the endometrial cavity with production of abundant tissue. Stone polyp forceps were used to grasp residual tissue left behind. The tissue was sent for frozen section-pathology reading demonstrated benign endometrium without atypia. NovaSure endometrial ablation was performed. The NovaSure instrument was placed into the uterine cavity and deployed in standard fashion. The cavity test was performed and was successful. The instrument was engaged with a thermal burn of 62 seconds being accomplished. Repeat  hysteroscopy was performed and demonstrated excellent char effect with the exception of less than 5% non ablated endometrium in the left uterine cornua. Procedure was then terminated with all instrumentation being removed from the vagina. The patient was then awakened mobilized and taken to the recovery room in satisfactory condition.  IV fluids: Per anesthesia  Urine output: 200 mL. EBL: Minimal mL.  Instruments, needles, and sponge counts were verified as correct.  Brayton Mars, MD 01/10/2016

## 2016-01-10 NOTE — Anesthesia Preprocedure Evaluation (Signed)
Anesthesia Evaluation  Patient identified by MRN, date of birth, ID band Patient awake    Reviewed: Allergy & Precautions, H&P , NPO status , Patient's Chart, lab work & pertinent test results  History of Anesthesia Complications Negative for: history of anesthetic complications  Airway Mallampati: III  TM Distance: <3 FB Neck ROM: full    Dental  (+) Poor Dentition, Chipped   Pulmonary neg shortness of breath, asthma ,    Pulmonary exam normal breath sounds clear to auscultation       Cardiovascular Exercise Tolerance: Good (-) angina(-) Past MI and (-) DOE negative cardio ROS Normal cardiovascular exam Rhythm:regular Rate:Normal     Neuro/Psych  Headaches, PSYCHIATRIC DISORDERS Anxiety    GI/Hepatic Neg liver ROS, GERD  Controlled,  Endo/Other  negative endocrine ROS  Renal/GU      Musculoskeletal   Abdominal   Peds  Hematology negative hematology ROS (+)   Anesthesia Other Findings Past Medical History: No date: Acute superficial gastritis without hemorrhage No date: Anxiety No date: GERD (gastroesophageal reflux disease)     Comment: better since surgery No date: Headache No date: Mild exercise-induced asthma     Comment: Triggered by cold air or scents,  No date: Muscular abdominal pain in left flank No date: Musculoskeletal chest pain No date: Other fatigue No date: Unrefreshed by sleep  Past Surgical History: 2003: COLONOSCOPY     Comment: normal 2003: LAPAROSCOPY     Comment: acid reflux surgery No date: TONSILLECTOMY No date: TONSILLECTOMY  BMI    Body Mass Index:  27.12 kg/m      Reproductive/Obstetrics negative OB ROS                             Anesthesia Physical Anesthesia Plan  ASA: III  Anesthesia Plan: General LMA   Post-op Pain Management:    Induction:   Airway Management Planned:   Additional Equipment:   Intra-op Plan:    Post-operative Plan:   Informed Consent: I have reviewed the patients History and Physical, chart, labs and discussed the procedure including the risks, benefits and alternatives for the proposed anesthesia with the patient or authorized representative who has indicated his/her understanding and acceptance.   Dental Advisory Given  Plan Discussed with: Anesthesiologist, CRNA and Surgeon  Anesthesia Plan Comments:         Anesthesia Quick Evaluation

## 2016-01-10 NOTE — Transfer of Care (Signed)
Immediate Anesthesia Transfer of Care Note  Patient: Dana Fitzpatrick  Procedure(s) Performed: Procedure(s): DILATATION & CURETTAGE/HYSTEROSCOPY WITH NOVASURE ABLATION (N/A)  Patient Location: PACU  Anesthesia Type:General  Level of Consciousness: awake  Airway & Oxygen Therapy: Patient Spontanous Breathing  Post-op Assessment: Report given to RN  Post vital signs: Reviewed  Last Vitals:  Vitals:   01/10/16 0827 01/10/16 1017  BP: (!) 138/92 128/71  Pulse: 89 83  Resp: 16   Temp: 36.9 C 36.8 C    Last Pain:  Vitals:   01/10/16 0827  TempSrc: Oral         Complications: No apparent anesthesia complications

## 2016-01-11 LAB — SURGICAL PATHOLOGY

## 2016-01-18 ENCOUNTER — Ambulatory Visit (INDEPENDENT_AMBULATORY_CARE_PROVIDER_SITE_OTHER): Payer: BC Managed Care – PPO | Admitting: Obstetrics and Gynecology

## 2016-01-18 ENCOUNTER — Encounter: Payer: Self-pay | Admitting: Obstetrics and Gynecology

## 2016-01-18 VITALS — BP 125/83 | HR 90 | Ht 65.0 in | Wt 163.4 lb

## 2016-01-18 DIAGNOSIS — Z09 Encounter for follow-up examination after completed treatment for conditions other than malignant neoplasm: Secondary | ICD-10-CM

## 2016-01-18 DIAGNOSIS — Z9889 Other specified postprocedural states: Secondary | ICD-10-CM

## 2016-01-18 DIAGNOSIS — N939 Abnormal uterine and vaginal bleeding, unspecified: Secondary | ICD-10-CM

## 2016-01-18 DIAGNOSIS — N8501 Benign endometrial hyperplasia: Secondary | ICD-10-CM

## 2016-01-18 NOTE — Patient Instructions (Signed)
1. Resume activities as tolerated with the exception of intercourse. No intercourse for at least 2 more weeks.

## 2016-01-18 NOTE — Progress Notes (Signed)
Chief complaint: 1. One week postop check 2. History of benign endometrial hyperplasia 3. Status post hysteroscopy/D&C with NovaSure endometrial ablation  Patient presents for follow-up 1 week after surgery. Bowel and bladder function are normal. She is experiencing only slight vaginal discharge. No abnormal uterine bleeding.  Pathology from surgery revealed benign endometrial hyperplasia  OBJECTIVE: BP 125/83   Pulse 90   Ht 5\' 5"  (1.651 m)   Wt 163 lb 6.4 oz (74.1 kg)   LMP  (LMP Unknown) Comment: upreg neg  BMI 27.19 kg/m  Physical exam-deferred  ASSESSMENT: 1. One week postop check-normal 2. Status post hysteroscopy/D&C with NovaSure endometrial ablation 3. History of benign endometrial hyperplasia  PLAN: 1. Resume activities as tolerated; may resume intercourse in 2 weeks 2. Maintain menstrual calendar monitoring 3. Maintain annual appointment with Lorelle Gibbs as scheduled on 02/09/2016  Brayton Mars, MD  Note: This dictation was prepared with Dragon dictation along with smaller phrase technology. Any transcriptional errors that result from this process are unintentional.

## 2016-01-31 ENCOUNTER — Telehealth: Payer: Self-pay | Admitting: Obstetrics and Gynecology

## 2016-01-31 NOTE — Telephone Encounter (Signed)
Pt states she has had bleeding since 12/6. It has gradually  increased . Passing large clots. Wearing a thick pad. Changing a couple times a day. Cramps are mild. Pt is s/p d&c and ablation (12/2015). AE scheduled for 12/27 with mns. Would you like her to monitor or be seen prior to AE? Pls advise. Pt aware you will get this message 12/19.

## 2016-01-31 NOTE — Telephone Encounter (Signed)
Patient called stating she has a question regarding the surgical procedure she had done. Thanks

## 2016-02-01 NOTE — Telephone Encounter (Signed)
Pt aware per mad to monitor as long as she is bleeding like a period. Contact office for weakness, dizzy, fever or pain. She will f/u with [facility] at ae on 12/27. If still having issues at that time-advised to make an appt with mad. Pt voices understanding.

## 2016-02-09 ENCOUNTER — Encounter: Payer: Self-pay | Admitting: Obstetrics and Gynecology

## 2016-02-09 ENCOUNTER — Ambulatory Visit (INDEPENDENT_AMBULATORY_CARE_PROVIDER_SITE_OTHER): Payer: BC Managed Care – PPO | Admitting: Obstetrics and Gynecology

## 2016-02-09 VITALS — BP 141/86 | HR 106 | Ht 66.0 in | Wt 166.5 lb

## 2016-02-09 DIAGNOSIS — Z01411 Encounter for gynecological examination (general) (routine) with abnormal findings: Secondary | ICD-10-CM

## 2016-02-09 DIAGNOSIS — N938 Other specified abnormal uterine and vaginal bleeding: Secondary | ICD-10-CM

## 2016-02-09 MED ORDER — TRANEXAMIC ACID 650 MG PO TABS: 1300.0000 mg | ORAL_TABLET | Freq: Three times a day (TID) | ORAL | 2 refills | Status: DC

## 2016-02-09 NOTE — Patient Instructions (Signed)
 Preventive Care 18-39 Years, Female Preventive care refers to lifestyle choices and visits with your health care provider that can promote health and wellness. What does preventive care include?  A yearly physical exam. This is also called an annual well check.  Dental exams once or twice a year.  Routine eye exams. Ask your health care provider how often you should have your eyes checked.  Personal lifestyle choices, including:  Daily care of your teeth and gums.  Regular physical activity.  Eating a healthy diet.  Avoiding tobacco and drug use.  Limiting alcohol use.  Practicing safe sex.  Taking vitamin and mineral supplements as recommended by your health care provider. What happens during an annual well check? The services and screenings done by your health care provider during your annual well check will depend on your age, overall health, lifestyle risk factors, and family history of disease. Counseling  Your health care provider may ask you questions about your:  Alcohol use.  Tobacco use.  Drug use.  Emotional well-being.  Home and relationship well-being.  Sexual activity.  Eating habits.  Work and work environment.  Method of birth control.  Menstrual cycle.  Pregnancy history. Screening  You may have the following tests or measurements:  Height, weight, and BMI.  Diabetes screening. This is done by checking your blood sugar (glucose) after you have not eaten for a while (fasting).  Blood pressure.  Lipid and cholesterol levels. These may be checked every 5 years starting at age 20.  Skin check.  Hepatitis C blood test.  Hepatitis B blood test.  Sexually transmitted disease (STD) testing.  BRCA-related cancer screening. This may be done if you have a family history of breast, ovarian, tubal, or peritoneal cancers.  Pelvic exam and Pap test. This may be done every 3 years starting at age 21. Starting at age 30, this may be done  every 5 years if you have a Pap test in combination with an HPV test. Discuss your test results, treatment options, and if necessary, the need for more tests with your health care provider. Vaccines  Your health care provider may recommend certain vaccines, such as:  Influenza vaccine. This is recommended every year.  Tetanus, diphtheria, and acellular pertussis (Tdap, Td) vaccine. You may need a Td booster every 10 years.  Varicella vaccine. You may need this if you have not been vaccinated.  HPV vaccine. If you are 26 or younger, you may need three doses over 6 months.  Measles, mumps, and rubella (MMR) vaccine. You may need at least one dose of MMR. You may also need a second dose.  Pneumococcal 13-valent conjugate (PCV13) vaccine. You may need this if you have certain conditions and were not previously vaccinated.  Pneumococcal polysaccharide (PPSV23) vaccine. You may need one or two doses if you smoke cigarettes or if you have certain conditions.  Meningococcal vaccine. One dose is recommended if you are age 19-21 years and a first-year college student living in a residence hall, or if you have one of several medical conditions. You may also need additional booster doses.  Hepatitis A vaccine. You may need this if you have certain conditions or if you travel or work in places where you may be exposed to hepatitis A.  Hepatitis B vaccine. You may need this if you have certain conditions or if you travel or work in places where you may be exposed to hepatitis B.  Haemophilus influenzae type b (Hib) vaccine. You may need   this if you have certain risk factors. Talk to your health care provider about which screenings and vaccines you need and how often you need them. This information is not intended to replace advice given to you by your health care provider. Make sure you discuss any questions you have with your health care provider. Document Released: 03/28/2001 Document Revised:  10/20/2015 Document Reviewed: 12/01/2014 Elsevier Interactive Patient Education  2017 Elsevier Inc.  

## 2016-02-09 NOTE — Progress Notes (Signed)
Subjective:   Dana Fitzpatrick is a 45 y.o. G39P0000 Caucasian female here for a routine well-woman exam.  Patient's last menstrual period was 01/19/2016.    Current complaints: still bleeding after D&C with ablation on 01/10/16, with some large clots and cramping. PCP: Marzetta Board       Doesn't need labs  Social History: Sexual: heterosexual Marital Status: married Living situation: with family Occupation: Pharmacist, hospital Tobacco/alcohol: no tobacco use Illicit drugs: no history of illicit drug use  The following portions of the patient's history were reviewed and updated as appropriate: allergies, current medications, past family history, past medical history, past social history, past surgical history and problem list.  Past Medical History Past Medical History:  Diagnosis Date  . Acute superficial gastritis without hemorrhage   . Anxiety   . GERD (gastroesophageal reflux disease)    better since surgery  . Headache   . Mild exercise-induced asthma    Triggered by cold air or scents,   . Muscular abdominal pain in left flank   . Musculoskeletal chest pain   . Other fatigue   . Unrefreshed by sleep     Past Surgical History Past Surgical History:  Procedure Laterality Date  . COLONOSCOPY  2003   normal  . DILITATION & CURRETTAGE/HYSTROSCOPY WITH NOVASURE ABLATION N/A 01/10/2016   Procedure: DILATATION & CURETTAGE/HYSTEROSCOPY WITH NOVASURE ABLATION;  Surgeon: Brayton Mars, MD;  Location: [facility] ORS;  Service: Gynecology;  Laterality: N/A;  . LAPAROSCOPY  2003   acid reflux surgery  . TONSILLECTOMY    . TONSILLECTOMY      Gynecologic History G0P0000  Patient's last menstrual period was 01/19/2016. Contraception: abstinence Last Pap: 2016. Results were: normal Last mammogram: 2017. Results were: multiple cysts   Obstetric History OB History  Gravida Para Term Preterm AB Living  0 0 0 0 0 0  SAB TAB Ectopic Multiple Live Births  0 0 0 0 0        Current  Medications Current Outpatient Prescriptions on File Prior to Visit  Medication Sig Dispense Refill  . albuterol (VENTOLIN HFA) 108 (90 BASE) MCG/ACT inhaler Inhale 1-2 puffs into the lungs every 6 (six) hours as needed. 18 g 5  . Aspirin-Salicylamide-Caffeine (BC HEADACHE PO) Take 1 packet by mouth daily as needed (headaches).    . cetirizine (ZYRTEC) 10 MG tablet Take 10 mg by mouth daily as needed for allergies.    . cholecalciferol (VITAMIN D) 1000 units tablet Take 1,000 Units by mouth daily.    . cyclobenzaprine (FLEXERIL) 5 MG tablet Take 1-2 tablets by mouth at bedtime as needed for muscle spasms.     Marland Kitchen ibuprofen (ADVIL,MOTRIN) 800 MG tablet Take 800 mg by mouth every 8 (eight) hours as needed for headache or moderate pain.     . sodium chloride (OCEAN) 0.65 % SOLN nasal spray Place 1 spray into both nostrils as needed for congestion.    Marland Kitchen venlafaxine XR (EFFEXOR-XR) 75 MG 24 hr capsule Take 1 capsule (75 mg total) by mouth daily with breakfast. 90 capsule 3   No current facility-administered medications on file prior to visit.     Review of Systems Patient denies any headaches, blurred vision, shortness of breath, chest pain, abdominal pain, problems with bowel movements, urination, or intercourse.  Objective:  BP (!) 141/86   Pulse (!) 106   Ht 5\' 6"  (1.676 m)   Wt 166 lb 8 oz (75.5 kg)   LMP 01/19/2016 Comment: upreg neg  BMI 26.87 kg/m  Physical Exam  General:  Well developed, well nourished, no acute distress. She is alert and oriented x3. Skin:  Warm and dry Neck:  Midline trachea, no thyromegaly or nodules Cardiovascular: Regular rate and rhythm, no murmur heard Lungs:  Effort normal, all lung fields clear to auscultation bilaterally Breasts:  No dominant palpable mass, retraction, or nipple discharge Abdomen:  Soft, non tender, no hepatosplenomegaly or masses Pelvic:  External genitalia is normal in appearance.  The vagina is normal in appearance. The cervix is  bulbous, no CMT. Moderate amount dark red rubra noted.  Thin prep pap is not done . Uterus is felt to be normal size, shape, and contour.  No adnexal masses or tenderness noted. Extremities:  No swelling or varicosities noted Psych:  She has a normal mood and affect  Assessment:   Healthy well-woman exam DUB S/P ablation on 01/10/16  Plan:  Will schedule pelvic u/s in 1 week and f/u accordingly. RX for Lysteda sent in and instructions on use given. F/U 1 year for AE, or sooner if needed Mammogram ordered  Aslin Farinas Rockney Ghee, CNM

## 2016-02-10 ENCOUNTER — Other Ambulatory Visit: Payer: Self-pay | Admitting: Obstetrics and Gynecology

## 2016-02-10 ENCOUNTER — Ambulatory Visit (INDEPENDENT_AMBULATORY_CARE_PROVIDER_SITE_OTHER): Payer: BC Managed Care – PPO

## 2016-02-10 DIAGNOSIS — N938 Other specified abnormal uterine and vaginal bleeding: Secondary | ICD-10-CM | POA: Diagnosis not present

## 2016-02-16 ENCOUNTER — Telehealth: Payer: Self-pay | Admitting: Obstetrics and Gynecology

## 2016-02-16 NOTE — Telephone Encounter (Signed)
Pt called for U/S results.

## 2016-02-16 NOTE — Telephone Encounter (Signed)
pls advise

## 2016-02-22 ENCOUNTER — Encounter: Payer: Self-pay | Admitting: Obstetrics and Gynecology

## 2016-02-22 ENCOUNTER — Ambulatory Visit (INDEPENDENT_AMBULATORY_CARE_PROVIDER_SITE_OTHER): Payer: BC Managed Care – PPO | Admitting: Obstetrics and Gynecology

## 2016-02-22 VITALS — BP 131/84 | HR 86 | Ht 65.5 in | Wt 162.9 lb

## 2016-02-22 DIAGNOSIS — N938 Other specified abnormal uterine and vaginal bleeding: Secondary | ICD-10-CM

## 2016-02-22 DIAGNOSIS — Z9889 Other specified postprocedural states: Secondary | ICD-10-CM | POA: Diagnosis not present

## 2016-02-22 DIAGNOSIS — D259 Leiomyoma of uterus, unspecified: Secondary | ICD-10-CM | POA: Diagnosis not present

## 2016-02-22 DIAGNOSIS — N8501 Benign endometrial hyperplasia: Secondary | ICD-10-CM

## 2016-02-22 MED ORDER — MEDROXYPROGESTERONE ACETATE 10 MG PO TABS: 30.0000 mg | ORAL_TABLET | Freq: Every day | ORAL | 1 refills | Status: DC

## 2016-02-22 NOTE — Progress Notes (Signed)
Chief complaint: 1. Dysfunctional uterine bleeding  The patient presents in the company of her husband for follow-up on dysfunctional uterine bleeding. She is status post hysteroscopy/D&C with NovaSure endometrial ablation on 01/10/2012. Postprocedure she has continued to have almost daily bleeding ranging from spotting to heavier bleeding with clots and associated cramping. She most recently was treated with Lysteda without significant improvement in her bleeding.  Patient has history of simple endometrial hyperplasia.  Past medical history, past surgical history, problem list, medications, and allergies are reviewed  OBJECTIVE: BP 131/84   Pulse 86   Ht 5' 5.5" (1.664 m)   Wt 162 lb 14.4 oz (73.9 kg)   LMP 01/19/2016 Comment: upreg neg  BMI 26.70 kg/m  Pleasant female in no acute distress Abdomen: Soft, nontender without organomegaly Pelvic exam: External genitalia normal BUS-normal Vagina-Brownish red blood in vaginal vault Cervix-no lesions; no cervical motion tenderness Uterus-midplane, normal size and shape, mobile, nontender Adnexa-nonpalpable and nontender  Ultrasound results (02/10/2016): Indications:AUB Findings:  The uterus measures 8.2 x 4.5 x 5.6 cm. Echo texture is heterogenous with evidence of focal masses. Within the uterus are multiple suspected fibroids measuring: Fibroid 1: Anterior right fundus measures 1.9 x 1.5 x 2 cm. (Subserosal) Fibroid 2: posterior right fundus measures 1.7 x 1.4 x 1.6 cm. (subserosal)  The Endometrium measures 6.7 mm.  Right Ovary measures 3.4 x 1.4 x 1.8 cm. It is normal in appearance. Left Ovary measures 4.5  2.6 x 2.8 cm. Small simple left ovarian cyst measures 2.4 x 1.8 x 2.1 cm. Survey of the adnexa demonstrates no adnexal masses. There is no free fluid in the cul de sac.  Impression: 1. Fibroid uterus 2. Simple left ovarian cyst  Recommendations: 1.Clinical correlation with the patient's History and Physical  Exam.   Tyrone Apple   Scan reviewed and agree with findings.  Melody Lyons, CNM  ASSESSMENT: 1. Dysfunctional uterine bleeding 2. Uterine fibroids 3. Status post hysteroscopy/D&C with NovaSure endometrial ablation 4. History of simple endometrial hyperplasia  PLAN: 1. Management options were reviewed including medical therapy versus surgery (Mirena IUD/Provera/Depo-Provera/Lupron; hysterectomy) 2. Provera 30 mg a day until hysterectomy 3. Schedule LAVH bilateral salpingectomy-February 2018 4. Return for preoperative appointment prior surgery  A total of 25 minutes were spent face-to-face with the patient during this encounter and over half of that time involved counseling and coordination of care.  Brayton Mars, MD  Note: This dictation was prepared with Dragon dictation along with smaller phrase technology. Any transcriptional errors that result from this process are unintentional.

## 2016-02-22 NOTE — Patient Instructions (Addendum)
1. Provera 30 mg a for 30 days or until his vasectomy 2. Schedule LAVH bilateral salpingectomy for February 2018 3. Return in 1 week prior to surgery for preoperative appointment    Hysterectomy Information A hysterectomy is a surgery in which your uterus is removed. This surgery may be done to treat various medical problems. After the surgery, you will no longer have menstrual periods. The surgery will also make you unable to become pregnant (sterile). The fallopian tubes and ovaries can be removed (bilateral salpingo-oophorectomy) during this surgery as well. Reasons for a hysterectomy  Persistent, abnormal bleeding.  Lasting (chronic) pelvic pain or infection.  The lining of the uterus (endometrium) starts growing outside the uterus (endometriosis).  The endometrium starts growing in the muscle of the uterus (adenomyosis).  The uterus falls down into the vagina (pelvic organ prolapse).  Noncancerous growths in the uterus (uterine fibroids) that cause symptoms.  Precancerous cells.  Cervical cancer or uterine cancer. Types of hysterectomies  Supracervical hysterectomy-In this type, the top part of the uterus is removed, but not the cervix.  Total hysterectomy-The uterus and cervix are removed.  Radical hysterectomy-The uterus, the cervix, and the fibrous tissue that holds the uterus in place in the pelvis (parametrium) are removed. Ways a hysterectomy can be performed  Abdominal hysterectomy-A large surgical cut (incision) is made in the abdomen. The uterus is removed through this incision.  Vaginal hysterectomy-An incision is made in the vagina. The uterus is removed through this incision. There are no abdominal incisions.  Conventional laparoscopic hysterectomy-Three or four small incisions are made in the abdomen. A thin, lighted tube with a camera (laparoscope) is inserted into one of the incisions. Other tools are put through the other incisions. The uterus is cut into  small pieces. The small pieces are removed through the incisions, or they are removed through the vagina.  Laparoscopically assisted vaginal hysterectomy (LAVH)-Three or four small incisions are made in the abdomen. Part of the surgery is performed laparoscopically and part vaginally. The uterus is removed through the vagina.  Robot-assisted laparoscopic hysterectomy-A laparoscope and other tools are inserted into 3 or 4 small incisions in the abdomen. A computer-controlled device is used to give the surgeon a 3D image and to help control the surgical instruments. This allows for more precise movements of surgical instruments. The uterus is cut into small pieces and removed through the incisions or removed through the vagina. What are the risks? Possible complications associated with this procedure include:  Bleeding and risk of blood transfusion. Tell your health care provider if you do not want to receive any blood products.  Blood clots in the legs or lung.  Infection.  Injury to surrounding organs.  Problems or side effects related to anesthesia.  Conversion to an abdominal hysterectomy from one of the other techniques. What to expect after a hysterectomy  You will be given pain medicine.  You will need to have someone with you for the first 3-5 days after you go home.  You will need to follow up with your surgeon in 2-4 weeks after surgery to evaluate your progress.  You may have early menopause symptoms such as hot flashes, night sweats, and insomnia.  If you had a hysterectomy for a problem that was not cancer or not a condition that could lead to cancer, then you no longer need Pap tests. However, even if you no longer need a Pap test, a regular exam is a good idea to make sure no other problems  are starting. This information is not intended to replace advice given to you by your health care provider. Make sure you discuss any questions you have with your health care  provider. Document Released: 07/26/2000 Document Revised: 07/08/2015 Document Reviewed: 10/07/2012 Elsevier Interactive Patient Education  2017 [employer].

## 2016-02-29 ENCOUNTER — Encounter: Payer: Self-pay | Admitting: Obstetrics and Gynecology

## 2016-02-29 ENCOUNTER — Ambulatory Visit (INDEPENDENT_AMBULATORY_CARE_PROVIDER_SITE_OTHER): Payer: BC Managed Care – PPO | Admitting: Obstetrics and Gynecology

## 2016-02-29 DIAGNOSIS — R8290 Unspecified abnormal findings in urine: Secondary | ICD-10-CM | POA: Diagnosis not present

## 2016-02-29 DIAGNOSIS — R319 Hematuria, unspecified: Secondary | ICD-10-CM

## 2016-02-29 DIAGNOSIS — N390 Urinary tract infection, site not specified: Secondary | ICD-10-CM | POA: Diagnosis not present

## 2016-02-29 LAB — POCT URINALYSIS DIPSTICK
Bilirubin, UA: NEGATIVE
Glucose, UA: NEGATIVE
Ketones, UA: NEGATIVE
Nitrite, UA: POSITIVE
PH UA: 6.5
PROTEIN UA: NEGATIVE
SPEC GRAV UA: 1.02
UROBILINOGEN UA: NEGATIVE

## 2016-02-29 MED ORDER — NITROFURANTOIN MONOHYD MACRO 100 MG PO CAPS: 100.0000 mg | ORAL_CAPSULE | Freq: Two times a day (BID) | ORAL | 0 refills | Status: DC

## 2016-02-29 NOTE — Progress Notes (Signed)
Pt did a urine drop off. She states that this weekend she was having painful urination. Per pharmacist took some AZO with relief. Wanted to have urine checked to be sure no infection. U/a showed pos nitrites, blood, and leuks. Culture sent. Macrobid erx. Pt aware.

## 2016-03-03 LAB — URINE CULTURE

## 2016-03-06 ENCOUNTER — Telehealth: Payer: Self-pay

## 2016-03-06 NOTE — Telephone Encounter (Signed)
Pt states this weekend she developed a red bump like area on her chin. It was so painful and sore she had to take a percocet to get some relief. She did not know if this was a result of the provera. Advised I was not sure. Advised her to use a hot compress. Ibup 800 q8 and tylenol es q 4. If sx gets worse she will contact office. Pt is currently taking macrobid for uti. Uti sx are much better.

## 2016-03-08 ENCOUNTER — Encounter: Payer: BC Managed Care – PPO | Admitting: Obstetrics and Gynecology

## 2016-04-03 ENCOUNTER — Other Ambulatory Visit: Payer: BC Managed Care – PPO

## 2016-04-04 ENCOUNTER — Ambulatory Visit (INDEPENDENT_AMBULATORY_CARE_PROVIDER_SITE_OTHER): Payer: BC Managed Care – PPO | Admitting: Obstetrics and Gynecology

## 2016-04-04 ENCOUNTER — Encounter: Payer: Self-pay | Admitting: Obstetrics and Gynecology

## 2016-04-04 VITALS — BP 130/85 | HR 96 | Ht 65.5 in | Wt 163.3 lb

## 2016-04-04 DIAGNOSIS — Z9889 Other specified postprocedural states: Secondary | ICD-10-CM

## 2016-04-04 DIAGNOSIS — Z01818 Encounter for other preprocedural examination: Secondary | ICD-10-CM

## 2016-04-04 DIAGNOSIS — N939 Abnormal uterine and vaginal bleeding, unspecified: Secondary | ICD-10-CM

## 2016-04-04 DIAGNOSIS — D259 Leiomyoma of uterus, unspecified: Secondary | ICD-10-CM

## 2016-04-04 NOTE — H&P (Addendum)
Subjective:  PREOPERATIVE HISTORY AND PHYSICAL   Date of surgery: 04/10/2016 Procedure: LAVH bilateral salpingectomy Diagnosis: 1. Abnormal uterine bleeding 2. 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 Uterine fibroids 4. Status post endometrial ablation   Patient is a 46 y.o. G0P0048female scheduled for LAVH bilateral salpingectomy on 04/10/2016. She is experiencing abnormal uterine bleeding following NovaSure endometrial ablation. She has a known history of uterine fibroids and history of simple endometrial hyperplasia without atypia. Preoperative endometrial biopsy demonstrated this diagnosis; the D&C during the endometrial ablation also demonstrated simple hyperplasia without atypia. Because of the patient's persistent abnormal uterine bleeding, she desires to proceed with surgery at this time.  Pertinent Gynecological History: No LMP recorded (lmp unknown). Contraception: vasectomy              OB History    Gravida Para Term Preterm AB Living   0 0 0 0 0 0   SAB TAB Ectopic Multiple Live Births   0 0 0 0 0     No LMP recorded (lmp unknown).    Past Medical History:  Diagnosis Date  . Acute superficial gastritis without hemorrhage   . Anxiety   . GERD (gastroesophageal reflux disease)    better since surgery  . Headache   . Mild exercise-induced asthma    Triggered by cold air or scents,   . Muscular abdominal pain in left flank   . Musculoskeletal chest pain   . Other fatigue   . Unrefreshed by sleep     Past Surgical History:  Procedure Laterality Date  . COLONOSCOPY  2003   normal  . DILITATION & CURRETTAGE/HYSTROSCOPY WITH NOVASURE ABLATION N/A 01/10/2016   Procedure: DILATATION & CURETTAGE/HYSTEROSCOPY WITH NOVASURE ABLATION;  Surgeon: Brayton Mars, MD;  Location: [facility] ORS;  Service: Gynecology;  Laterality: N/A;  . LAPAROSCOPY  2003   acid reflux surgery  . TONSILLECTOMY    . TONSILLECTOMY      OB History  Gravida Para Term  Preterm AB Living  0 0 0 0 0 0  SAB TAB Ectopic Multiple Live Births  0 0 0 0 0        Social History   Social History  . Marital status: Married    Spouse name: N/A  . Number of children: N/A  . Years of education: N/A   Social History Main Topics  . Smoking status: Never Smoker  . Smokeless tobacco: Never Used  . Alcohol use 0.6 oz/week    1 Glasses of wine per week     Comment: occasional  . Drug use: No  . Sexual activity: Yes    Partners: Male    Birth control/ protection: Other-see comments     Comment: vasectomy   Other Topics Concern  . None   Social History Narrative  . None    Family History  Problem Relation Age of Onset  . Heart disease Mother   . Dementia Mother   . Heart attack Father   . Breast cancer Paternal Aunt 24  . Ovarian cancer Neg Hx   . Colon cancer Neg Hx   . Diabetes Neg Hx      (Not in a hospital admission)  No Known Allergies  Review of Systems Constitutional: No recent fever/chills/sweats Respiratory: No recent cough/bronchitis Cardiovascular: No chest pain Gastrointestinal: No recent nausea/vomiting/diarrhea Genitourinary: No UTI symptoms Hematologic/lymphatic:No history of coagulopathy or recent blood thinner use    Objective:    BP 130/85   Pulse 96  Ht 5' 5.5" (1.664 m)   Wt 163 lb 4.8 oz (74.1 kg)   LMP  (LMP Unknown)   BMI 26.76 kg/m   General:   Normal  Skin:   normal  HEENT:  Normal  Neck:  Supple without Adenopathy or Thyromegaly  Lungs:   Heart:              Breasts:   Abdomen:  Pelvis:  M/S   Extremeties:  Neuro:    clear to auscultation bilaterally   Normal without murmur   Not Examined   soft, non-tender; bowel sounds normal; no masses,  no organomegaly   Exam deferred to OR  No CVAT  Warm/Dry   Normal          02/22/2016 Pelvic exam: External genitalia normal BUS-normal Vagina-Brownish red blood in vaginal vault Cervix-no lesions; no cervical motion  tenderness Uterus-midplane, normal size and shape, mobile, nontender Adnexa-nonpalpable and nontender    Assessment:    1. Abnormal uterine bleeding refractory to medical and surgical therapy 2. Status post NovaSure endometrial ablation with D&C 3. 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 She is understanding of the planned procedure and is aware of and is accepting of all surgical risks which include but are not limited to bleeding, infection, pelvic organ injury with need for repair, blood clot disorders, anesthesia risks, etc. All questions have been answered. Informed consent is given. Patient is ready and willing to proceed with surgery as scheduled.  Brayton Mars, MD  Note: This dictation was prepared with Dragon dictation along with smaller phrase technology. Any transcriptional errors that result from this process are unintentional.

## 2016-04-04 NOTE — Progress Notes (Signed)
Subjective:  PREOPERATIVE HISTORY AND PHYSICAL   Date of surgery: 04/10/2016 Procedure: LAVH bilateral salpingectomy Diagnosis: 1. Abnormal uterine bleeding 2. 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 Uterine fibroids 4. Status post endometrial ablation   Patient is a 46 y.o. [id number] scheduled for LAVH bilateral salpingectomy on 04/10/2016. She is experiencing abnormal uterine bleeding following NovaSure endometrial ablation. She has a known history of uterine fibroids and history of simple endometrial hyperplasia without atypia. Preoperative endometrial biopsy demonstrated this diagnosis; the D&C during the endometrial ablation also demonstrated simple hyperplasia without atypia. Because of the patient's persistent abnormal uterine bleeding, she desires to proceed with surgery at this time.  Pertinent Gynecological History: No LMP recorded (lmp unknown). Contraception: vasectomy              OB History    Gravida Para Term Preterm AB Living   0 0 0 0 0 0   SAB TAB Ectopic Multiple Live Births   0 0 0 0 0     No LMP recorded (lmp unknown).    Past Medical History:  Diagnosis Date  . Acute superficial gastritis without hemorrhage   . Anxiety   . GERD (gastroesophageal reflux disease)    better since surgery  . Headache   . Mild exercise-induced asthma    Triggered by cold air or scents,   . Muscular abdominal pain in left flank   . Musculoskeletal chest pain   . Other fatigue   . Unrefreshed by sleep     Past Surgical History:  Procedure Laterality Date  . COLONOSCOPY  2003   normal  . DILITATION & CURRETTAGE/HYSTROSCOPY WITH NOVASURE ABLATION N/A 01/10/2016   Procedure: DILATATION & CURETTAGE/HYSTEROSCOPY WITH NOVASURE ABLATION;  Surgeon: Brayton Mars, MD;  Location: [facility] ORS;  Service: Gynecology;  Laterality: N/A;  . LAPAROSCOPY  2003   acid reflux surgery  . TONSILLECTOMY    . TONSILLECTOMY      OB History  Gravida Para Term  Preterm AB Living  0 0 0 0 0 0  SAB TAB Ectopic Multiple Live Births  0 0 0 0 0        Social History   Social History  . Marital status: Married    Spouse name: N/A  . Number of children: N/A  . Years of education: N/A   Social History Main Topics  . Smoking status: Never Smoker  . Smokeless tobacco: Never Used  . Alcohol use 0.6 oz/week    1 Glasses of wine per week     Comment: occasional  . Drug use: No  . Sexual activity: Yes    Partners: Male    Birth control/ protection: Other-see comments     Comment: vasectomy   Other Topics Concern  . None   Social History Narrative  . None    Family History  Problem Relation Age of Onset  . Heart disease Mother   . Dementia Mother   . Heart attack Father   . Breast cancer Paternal Aunt 68  . Ovarian cancer Neg Hx   . Colon cancer Neg Hx   . Diabetes Neg Hx      (Not in a hospital admission)  No Known Allergies  Review of Systems Constitutional: No recent fever/chills/sweats Respiratory: No recent cough/bronchitis Cardiovascular: No chest pain Gastrointestinal: No recent nausea/vomiting/diarrhea Genitourinary: No UTI symptoms Hematologic/lymphatic:No history of coagulopathy or recent blood thinner use    Objective:    BP 130/85   Pulse 96  Ht 5' 5.5" (1.664 m)   Wt 163 lb 4.8 oz (74.1 kg)   LMP  (LMP Unknown)   BMI 26.76 kg/m   General:   Normal  Skin:   normal  HEENT:  Normal  Neck:  Supple without Adenopathy or Thyromegaly  Lungs:   Heart:              Breasts:   Abdomen:  Pelvis:  M/S   Extremeties:  Neuro:    clear to auscultation bilaterally   Normal without murmur   Not Examined   soft, non-tender; bowel sounds normal; no masses,  no organomegaly   Exam deferred to OR  No CVAT  Warm/Dry   Normal          02/22/2016 Pelvic exam: External genitalia normal BUS-normal Vagina-Brownish red blood in vaginal vault Cervix-no lesions; no cervical motion  tenderness Uterus-midplane, normal size and shape, mobile, nontender Adnexa-nonpalpable and nontender   Assessment:    1. Abnormal uterine bleeding refractory to medical and surgical therapy 2. Status post NovaSure endometrial ablation with D&C 3. 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 She is understanding of the planned procedure and is aware of and is accepting of all surgical risks which include but are not limited to bleeding, infection, pelvic organ injury with need for repair, blood clot disorders, anesthesia risks, etc. All questions have been answered. Informed consent is given. Patient is ready and willing to proceed with surgery as scheduled.  Brayton Mars, MD  Note: This dictation was prepared with Dragon dictation along with smaller phrase technology. Any transcriptional errors that result from this process are unintentional.

## 2016-04-04 NOTE — Patient Instructions (Signed)
1. Return in 1 week after surgery for postop check 

## 2016-04-05 DIAGNOSIS — Z0289 Encounter for other administrative examinations: Secondary | ICD-10-CM

## 2016-04-05 NOTE — Patient Instructions (Addendum)
  Your procedure is scheduled on: 04-10-16 [facility]) Report to Same Day Surgery 2nd floor medical mall [facility] Entrance-take elevator on left to 2nd floor.  Check in with surgery information desk.) To find out your arrival time please call [phone] between 1PM - 3PM on 04-07-16 (FRIDAY)  Remember: Instructions that are not followed completely may result in serious medical risk, up to and including death, or upon the discretion of your surgeon and anesthesiologist your surgery may need to be rescheduled.    _x___ 1. Do not eat food or drink liquids after midnight. No gum chewing or hard candies.     __x__ 2. No Alcohol for 24 hours before or after surgery.   __x__3. No Smoking for 24 prior to surgery.   ____  4. Bring all medications with you on the day of surgery if instructed.    __x__ 5. Notify your doctor if there is any change in your medical condition     (cold, fever, infections).     Do not wear jewelry, make-up, hairpins, clips or nail polish.  Do not wear lotions, powders, or perfumes. You may wear deodorant.  Do not shave 48 hours prior to surgery. Men may shave face and neck.  Do not bring valuables to the hospital.    [facility] is not responsible for any belongings or valuables.               Contacts, dentures or bridgework may not be worn into surgery.  Leave your suitcase in the car. After surgery it may be brought to your room.  For patients admitted to the hospital, discharge time is determined by your treatment team.   Patients discharged the day of surgery will not be allowed to drive home.  You will need someone to drive you home and stay with you the night of your procedure.    Please read over the following fact sheets that you were given:   [facility] Preparing for Surgery and or MRSA Information   _x___ Take these medicines the morning of surgery with A SIP OF WATER:    1.EFFEXOR   2.  3.  4.  5.  6.  ____Fleets enema or Magnesium Citrate  as directed.   _x___ Use CHG Soap or sage wipes as directed on instruction sheet   _X___ Use inhalers on the day of surgery and bring to hospital day of surgery-USE ALBUTEROL INHALER AT [place]  ____ Stop metformin 2 days prior to surgery    ____ Take 1/2 of usual insulin dose the night before surgery and none on the morning of surgery.   ____ Stop Aspirin, Coumadin, Pllavix ,Eliquis, Effient, or Pradaxa  x__ Stop Anti-inflammatories such as Advil, Aleve, Ibuprofen, Motrin, Naproxen,          Naprosyn, Goodies powders (BC POWDERS) or aspirin products NOW-Ok to take Tylenol.   ____ Stop supplements until after surgery.    ____ Bring C-Pap to the hospital.

## 2016-04-06 ENCOUNTER — Encounter
Admission: RE | Admit: 2016-04-06 | Discharge: 2016-04-06 | Disposition: A | Discharge status: Home / Self Care | Payer: BC Managed Care – PPO | Source: Ambulatory Visit | Attending: Obstetrics and Gynecology | Admitting: Obstetrics and Gynecology

## 2016-04-06 DIAGNOSIS — D259 Leiomyoma of uterus, unspecified: Secondary | ICD-10-CM | POA: Diagnosis not present

## 2016-04-06 DIAGNOSIS — Z0183 Encounter for blood typing: Secondary | ICD-10-CM | POA: Diagnosis not present

## 2016-04-06 DIAGNOSIS — N938 Other specified abnormal uterine and vaginal bleeding: Secondary | ICD-10-CM | POA: Insufficient documentation

## 2016-04-06 DIAGNOSIS — N8501 Benign endometrial hyperplasia: Secondary | ICD-10-CM | POA: Diagnosis not present

## 2016-04-06 DIAGNOSIS — Z01812 Encounter for preprocedural laboratory examination: Secondary | ICD-10-CM | POA: Insufficient documentation

## 2016-04-06 LAB — RAPID HIV SCREEN (HIV 1/2 AB+AG)
HIV 1/2 Antibodies: NONREACTIVE
HIV-1 P24 Antigen - HIV24: NONREACTIVE

## 2016-04-06 LAB — CBC WITH DIFFERENTIAL/PLATELET
Basophils Absolute: 0.1 10*3/uL (ref 0–0.1)
Basophils Relative: 1 %
Eosinophils Absolute: 0.1 10*3/uL (ref 0–0.7)
Eosinophils Relative: 1 %
HCT: 42.9 % (ref 35.0–47.0)
HEMOGLOBIN: 14.7 g/dL (ref 12.0–16.0)
LYMPHS ABS: 1.7 10*3/uL (ref 1.0–3.6)
LYMPHS PCT: 23 %
MCH: 31.9 pg (ref 26.0–34.0)
MCHC: 34.2 g/dL (ref 32.0–36.0)
MCV: 93.3 fL (ref 80.0–100.0)
MONOS PCT: 4 %
Monocytes Absolute: 0.3 10*3/uL (ref 0.2–0.9)
NEUTROS PCT: 71 %
Neutro Abs: 5.2 10*3/uL (ref 1.4–6.5)
Platelets: 328 10*3/uL (ref 150–440)
RBC: 4.6 MIL/uL (ref 3.80–5.20)
RDW: 12.9 % (ref 11.5–14.5)
WBC: 7.4 10*3/uL (ref 3.6–11.0)

## 2016-04-06 LAB — TYPE AND SCREEN
ABO/RH(D): O POS
ANTIBODY SCREEN: NEGATIVE

## 2016-04-07 LAB — RPR: RPR Ser Ql: NONREACTIVE

## 2016-04-09 MED ORDER — CEFAZOLIN SODIUM-DEXTROSE 2-4 GM/100ML-% IV SOLN
2.0000 g | INTRAVENOUS | Status: AC
  Administered 2016-04-10: 2 g via INTRAVENOUS

## 2016-04-10 ENCOUNTER — Observation Stay
Admission: RE | Admit: 2016-04-10 | Discharge: 2016-04-11 | Disposition: A | Discharge status: Home / Self Care | Payer: BC Managed Care – PPO | Source: Ambulatory Visit | Attending: Obstetrics and Gynecology | Admitting: Obstetrics and Gynecology

## 2016-04-10 ENCOUNTER — Ambulatory Visit: Payer: BC Managed Care – PPO | Admitting: Anesthesiology

## 2016-04-10 ENCOUNTER — Encounter
Admission: RE | Disposition: A | Discharge status: Home / Self Care | Payer: Self-pay | Source: Ambulatory Visit | Attending: Obstetrics and Gynecology

## 2016-04-10 DIAGNOSIS — N8501 Benign endometrial hyperplasia: Secondary | ICD-10-CM | POA: Diagnosis not present

## 2016-04-10 DIAGNOSIS — J45990 Exercise induced bronchospasm: Secondary | ICD-10-CM | POA: Diagnosis not present

## 2016-04-10 DIAGNOSIS — F419 Anxiety disorder, unspecified: Secondary | ICD-10-CM | POA: Diagnosis not present

## 2016-04-10 DIAGNOSIS — N72 Inflammatory disease of cervix uteri: Secondary | ICD-10-CM | POA: Diagnosis not present

## 2016-04-10 DIAGNOSIS — Z90710 Acquired absence of both cervix and uterus: Secondary | ICD-10-CM | POA: Diagnosis present

## 2016-04-10 DIAGNOSIS — D259 Leiomyoma of uterus, unspecified: Secondary | ICD-10-CM

## 2016-04-10 DIAGNOSIS — N838 Other noninflammatory disorders of ovary, fallopian tube and broad ligament: Secondary | ICD-10-CM | POA: Insufficient documentation

## 2016-04-10 DIAGNOSIS — N939 Abnormal uterine and vaginal bleeding, unspecified: Secondary | ICD-10-CM | POA: Diagnosis not present

## 2016-04-10 HISTORY — PX: LAPAROSCOPIC VAGINAL HYSTERECTOMY WITH SALPINGECTOMY: SHX6680

## 2016-04-10 LAB — POCT PREGNANCY, URINE: Preg Test, Ur: NEGATIVE

## 2016-04-10 LAB — ABO/RH: ABO/RH(D): O POS

## 2016-04-10 SURGERY — HYSTERECTOMY, VAGINAL, LAPAROSCOPY-ASSISTED, WITH SALPINGECTOMY
Anesthesia: General | Laterality: Bilateral

## 2016-04-10 MED ORDER — LACTATED RINGERS IV SOLN
INTRAVENOUS | Status: DC
  Administered 2016-04-10 – 2016-04-11 (×2): via INTRAVENOUS

## 2016-04-10 MED ORDER — OXYCODONE-ACETAMINOPHEN 5-325 MG PO TABS: 1.0000 | ORAL_TABLET | ORAL | Status: DC | PRN

## 2016-04-10 MED ORDER — PROMETHAZINE HCL 25 MG/ML IJ SOLN
6.2500 mg | Freq: Once | INTRAMUSCULAR | Status: AC
  Administered 2016-04-10: 6.25 mg via INTRAVENOUS

## 2016-04-10 MED ORDER — ONDANSETRON HCL 4 MG/2ML IJ SOLN
INTRAMUSCULAR | Status: DC | PRN
  Administered 2016-04-10: 4 mg via INTRAVENOUS

## 2016-04-10 MED ORDER — SODIUM CHLORIDE 0.9 % IJ SOLN
INTRAMUSCULAR | Status: AC
  Filled 2016-04-10: qty 10

## 2016-04-10 MED ORDER — ONDANSETRON HCL 4 MG/2ML IJ SOLN: 4.0000 mg | Freq: Once | INTRAMUSCULAR | Status: DC | PRN

## 2016-04-10 MED ORDER — MIDAZOLAM HCL 2 MG/2ML IJ SOLN
INTRAMUSCULAR | Status: DC | PRN
  Administered 2016-04-10: 2 mg via INTRAVENOUS

## 2016-04-10 MED ORDER — SUCCINYLCHOLINE CHLORIDE 20 MG/ML IJ SOLN
INTRAMUSCULAR | Status: DC | PRN
  Administered 2016-04-10: 100 mg via INTRAVENOUS

## 2016-04-10 MED ORDER — FAMOTIDINE 20 MG PO TABS
20.0000 mg | ORAL_TABLET | Freq: Once | ORAL | Status: AC
  Administered 2016-04-10: 20 mg via ORAL

## 2016-04-10 MED ORDER — FENTANYL CITRATE (PF) 100 MCG/2ML IJ SOLN
INTRAMUSCULAR | Status: AC
  Filled 2016-04-10: qty 2

## 2016-04-10 MED ORDER — KETOROLAC TROMETHAMINE 30 MG/ML IJ SOLN
INTRAMUSCULAR | Status: DC | PRN
  Administered 2016-04-10: 30 mg via INTRAVENOUS

## 2016-04-10 MED ORDER — FENTANYL CITRATE (PF) 100 MCG/2ML IJ SOLN
25.0000 ug | INTRAMUSCULAR | Status: DC | PRN
  Administered 2016-04-10: 25 ug via INTRAVENOUS

## 2016-04-10 MED ORDER — ACETAMINOPHEN 325 MG PO TABS: 650.0000 mg | ORAL_TABLET | ORAL | Status: DC | PRN

## 2016-04-10 MED ORDER — ONDANSETRON HCL 4 MG/2ML IJ SOLN
4.0000 mg | INTRAMUSCULAR | Status: DC | PRN
  Administered 2016-04-10: 4 mg via INTRAVENOUS
  Filled 2016-04-10: qty 2

## 2016-04-10 MED ORDER — LIDOCAINE HCL (CARDIAC) 20 MG/ML IV SOLN
INTRAVENOUS | Status: DC | PRN
  Administered 2016-04-10: 100 mg via INTRAVENOUS

## 2016-04-10 MED ORDER — DEXAMETHASONE SODIUM PHOSPHATE 10 MG/ML IJ SOLN
INTRAMUSCULAR | Status: AC
  Filled 2016-04-10: qty 1

## 2016-04-10 MED ORDER — SUGAMMADEX SODIUM 200 MG/2ML IV SOLN
INTRAVENOUS | Status: AC
  Filled 2016-04-10: qty 2

## 2016-04-10 MED ORDER — LACTATED RINGERS IV SOLN
INTRAVENOUS | Status: DC
  Administered 2016-04-10: 09:00:00 via INTRAVENOUS

## 2016-04-10 MED ORDER — MORPHINE SULFATE (PF) 2 MG/ML IV SOLN
1.0000 mg | INTRAVENOUS | Status: DC | PRN
  Filled 2016-04-10 (×2): qty 1

## 2016-04-10 MED ORDER — PROMETHAZINE HCL 25 MG/ML IJ SOLN
INTRAMUSCULAR | Status: AC
  Filled 2016-04-10: qty 1

## 2016-04-10 MED ORDER — FAMOTIDINE 20 MG PO TABS
ORAL_TABLET | ORAL | Status: AC
  Administered 2016-04-10: 20 mg via ORAL
  Filled 2016-04-10: qty 1

## 2016-04-10 MED ORDER — SUGAMMADEX SODIUM 200 MG/2ML IV SOLN
INTRAVENOUS | Status: DC | PRN
Start: 2016-04-10 — End: 2016-04-10
  Administered 2016-04-10: 200 mg via INTRAVENOUS

## 2016-04-10 MED ORDER — ONDANSETRON HCL 4 MG/2ML IJ SOLN
INTRAMUSCULAR | Status: AC
Start: 2016-04-10 — End: 2016-04-10
  Administered 2016-04-10: 4 mg
  Filled 2016-04-10: qty 2

## 2016-04-10 MED ORDER — DOCUSATE SODIUM 100 MG PO CAPS
100.0000 mg | ORAL_CAPSULE | Freq: Two times a day (BID) | ORAL | Status: DC
  Administered 2016-04-11: 100 mg via ORAL
  Filled 2016-04-10 (×3): qty 1

## 2016-04-10 MED ORDER — PROPOFOL 10 MG/ML IV BOLUS
INTRAVENOUS | Status: AC
  Filled 2016-04-10: qty 20

## 2016-04-10 MED ORDER — ACETAMINOPHEN 10 MG/ML IV SOLN
INTRAVENOUS | Status: DC | PRN
  Administered 2016-04-10: 1000 mg via INTRAVENOUS

## 2016-04-10 MED ORDER — KETOROLAC TROMETHAMINE 30 MG/ML IJ SOLN
30.0000 mg | Freq: Four times a day (QID) | INTRAMUSCULAR | Status: DC
  Administered 2016-04-10 – 2016-04-11 (×4): 30 mg via INTRAVENOUS
  Filled 2016-04-10 (×5): qty 1

## 2016-04-10 MED ORDER — DEXAMETHASONE SODIUM PHOSPHATE 10 MG/ML IJ SOLN
INTRAMUSCULAR | Status: DC | PRN
  Administered 2016-04-10: 10 mg via INTRAVENOUS

## 2016-04-10 MED ORDER — LACTATED RINGERS IV SOLN
INTRAVENOUS | Status: DC
  Administered 2016-04-10: 10:00:00 via INTRAVENOUS

## 2016-04-10 MED ORDER — ROCURONIUM BROMIDE 100 MG/10ML IV SOLN
INTRAVENOUS | Status: DC | PRN
  Administered 2016-04-10: 10 mg via INTRAVENOUS
  Administered 2016-04-10: 20 mg via INTRAVENOUS
  Administered 2016-04-10: 5 mg via INTRAVENOUS
  Administered 2016-04-10: 10 mg via INTRAVENOUS

## 2016-04-10 MED ORDER — ONDANSETRON HCL 4 MG/2ML IJ SOLN
INTRAMUSCULAR | Status: AC
  Filled 2016-04-10: qty 2

## 2016-04-10 MED ORDER — KETOROLAC TROMETHAMINE 30 MG/ML IJ SOLN: 30.0000 mg | Freq: Four times a day (QID) | INTRAMUSCULAR | Status: DC

## 2016-04-10 MED ORDER — ALBUTEROL SULFATE (2.5 MG/3ML) 0.083% IN NEBU: 2.5000 mg | INHALATION_SOLUTION | Freq: Four times a day (QID) | RESPIRATORY_TRACT | Status: DC | PRN

## 2016-04-10 MED ORDER — FENTANYL CITRATE (PF) 250 MCG/5ML IJ SOLN
INTRAMUSCULAR | Status: AC
  Filled 2016-04-10: qty 5

## 2016-04-10 MED ORDER — MIDAZOLAM HCL 2 MG/2ML IJ SOLN
INTRAMUSCULAR | Status: AC
  Filled 2016-04-10: qty 2

## 2016-04-10 MED ORDER — PROPOFOL 10 MG/ML IV BOLUS
INTRAVENOUS | Status: DC | PRN
  Administered 2016-04-10: 150 mg via INTRAVENOUS

## 2016-04-10 MED ORDER — FENTANYL CITRATE (PF) 100 MCG/2ML IJ SOLN
INTRAMUSCULAR | Status: DC | PRN
  Administered 2016-04-10 (×4): 50 ug via INTRAVENOUS

## 2016-04-10 SURGICAL SUPPLY — 45 items
BAG URO DRAIN 2000ML W/SPOUT (MISCELLANEOUS) ×3 IMPLANT
BLADE SURG SZ11 CARB STEEL (BLADE) ×3 IMPLANT
CATH FOLEY 2WAY  5CC 16FR (CATHETERS) ×2
CATH URTH 16FR FL 2W BLN LF (CATHETERS) ×1 IMPLANT
CHLORAPREP W/TINT 26ML (MISCELLANEOUS) ×3 IMPLANT
CORD MONOPOLAR M/FML 12FT (MISCELLANEOUS) ×3 IMPLANT
DRSG TEGADERM 2X2.25 PEDS (GAUZE/BANDAGES/DRESSINGS) ×9 IMPLANT
ELECT REM PT RETURN 9FT ADLT (ELECTROSURGICAL) ×3
ELECTRODE REM PT RTRN 9FT ADLT (ELECTROSURGICAL) ×1 IMPLANT
GAUZE PETRO XEROFOAM 1X8 (MISCELLANEOUS) ×3 IMPLANT
GLOVE BIO SURGEON STRL SZ 6.5 (GLOVE) ×6 IMPLANT
GLOVE BIO SURGEON STRL SZ8 (GLOVE) ×15 IMPLANT
GLOVE BIO SURGEONS STRL SZ 6.5 (GLOVE) ×3
GLOVE INDICATOR 7.0 STRL GRN (GLOVE) ×9 IMPLANT
GOWN STRL REUS W/ TWL LRG LVL3 (GOWN DISPOSABLE) ×2 IMPLANT
GOWN STRL REUS W/ TWL XL LVL3 (GOWN DISPOSABLE) ×1 IMPLANT
GOWN STRL REUS W/TWL LRG LVL3 (GOWN DISPOSABLE) ×4
GOWN STRL REUS W/TWL XL LVL3 (GOWN DISPOSABLE) ×2
IRRIGATION STRYKERFLOW (MISCELLANEOUS) ×1 IMPLANT
IRRIGATOR STRYKERFLOW (MISCELLANEOUS) ×3
IV LACTATED RINGERS 1000ML (IV SOLUTION) ×3 IMPLANT
KIT PINK PAD W/HEAD ARE REST (MISCELLANEOUS) ×3
KIT PINK PAD W/HEAD ARM REST (MISCELLANEOUS) ×1 IMPLANT
KIT RM TURNOVER CYSTO AR (KITS) ×3 IMPLANT
LABEL OR SOLS (LABEL) ×3 IMPLANT
LIQUID BAND (GAUZE/BANDAGES/DRESSINGS) ×3 IMPLANT
PACK BASIN MINOR ARMC (MISCELLANEOUS) ×3 IMPLANT
PACK GYN LAPAROSCOPIC (MISCELLANEOUS) ×3 IMPLANT
PAD OB MATERNITY 4.3X12.25 (PERSONAL CARE ITEMS) ×3 IMPLANT
PENCIL ELECTRO HAND CTR (MISCELLANEOUS) ×3 IMPLANT
SCISSORS METZENBAUM CVD 33 (INSTRUMENTS) IMPLANT
SHEARS HARMONIC ACE PLUS 36CM (ENDOMECHANICALS) ×3 IMPLANT
SLEEVE ENDOPATH XCEL 5M (ENDOMECHANICALS) ×6 IMPLANT
SUT CHROMIC 2 0 CT 1 (SUTURE) ×42 IMPLANT
SUT MNCRL 4-0 (SUTURE) ×2
SUT MNCRL 4-0 27XMFL (SUTURE) ×1
SUT VIC AB 0 CT1 27 (SUTURE) ×2
SUT VIC AB 0 CT1 27XCR 8 STRN (SUTURE) ×1 IMPLANT
SUT VIC AB 0 CT1 36 (SUTURE) ×3 IMPLANT
SUTURE MNCRL 4-0 27XMF (SUTURE) ×1 IMPLANT
SYR 30ML LL (SYRINGE) ×3 IMPLANT
SYRINGE 10CC LL (SYRINGE) ×3 IMPLANT
TAPE TRANSPORE STRL 2 31045 (GAUZE/BANDAGES/DRESSINGS) ×3 IMPLANT
TROCAR XCEL NON-BLD 5MMX100MML (ENDOMECHANICALS) ×3 IMPLANT
TUBING INSUFFLATOR HEATED (MISCELLANEOUS) ×3 IMPLANT

## 2016-04-10 NOTE — Interval H&P Note (Signed)
History and Physical Interval Note:  04/10/2016 9:38 AM  Dana Fitzpatrick  has presented today for surgery, with the diagnosis of DYSFUNTIONAL UTERINE BLEEDING, BENIGN ENDOMETRIAL HYPERPLASIA, UTERINE LEIOMYOMA  The various methods of treatment have been discussed with the patient and family. After consideration of risks, benefits and other options for treatment, the patient has consented to  Procedure(s): LAPAROSCOPIC ASSISTED VAGINAL HYSTERECTOMY WITH BILATERAL SALPINGECTOMY (Bilateral) as a surgical intervention .  The patient's history has been reviewed, patient examined, no change in status, stable for surgery.  I have reviewed the patient's chart and labs.  Questions were answered to the patient's satisfaction.     Hassell Done A Carron Mcmurry

## 2016-04-10 NOTE — Transfer of Care (Signed)
Immediate Anesthesia Transfer of Care Note  Patient: Dana Fitzpatrick  Procedure(s) Performed: Procedure(s): LAPAROSCOPIC ASSISTED VAGINAL HYSTERECTOMY WITH BILATERAL SALPINGECTOMY (Bilateral)  Patient Location: PACU  Anesthesia Type:General  Level of Consciousness: sedated  Airway & Oxygen Therapy: Patient Spontanous Breathing and Patient connected to face mask oxygen  Post-op Assessment: Report given to RN and Post -op Vital signs reviewed and stable  Post vital signs: Reviewed  Last Vitals:  Vitals:   04/10/16 0959 04/10/16 1243  BP:  (!) 142/96  Pulse:  100  Resp:  16  Temp: 36.9 C 36.3 C    Last Pain:  Vitals:   04/10/16 1243  TempSrc:   PainSc: 5          Complications: No apparent anesthesia complications

## 2016-04-10 NOTE — Anesthesia Postprocedure Evaluation (Signed)
Anesthesia Post Note  Patient: Dana Fitzpatrick  Procedure(s) Performed: Procedure(s) (LRB): LAPAROSCOPIC ASSISTED VAGINAL HYSTERECTOMY WITH BILATERAL SALPINGECTOMY (Bilateral)  Patient location during evaluation: PACU Anesthesia Type: General Level of consciousness: awake and alert and oriented Pain management: pain level controlled Vital Signs Assessment: post-procedure vital signs reviewed and stable Respiratory status: spontaneous breathing Cardiovascular status: blood pressure returned to baseline Anesthetic complications: no     Last Vitals:  Vitals:   04/10/16 1418 04/10/16 1513  BP: 124/75 127/85  Pulse: 94 84  Resp: 15 16  Temp: 36.3 C 36.7 C    Last Pain:  Vitals:   04/10/16 1513  TempSrc: Oral  PainSc:                  Lauraine Crespo

## 2016-04-10 NOTE — Anesthesia Post-op Follow-up Note (Cosign Needed)
Anesthesia QCDR form completed.        

## 2016-04-10 NOTE — Anesthesia Preprocedure Evaluation (Signed)
Anesthesia Evaluation  Patient identified by MRN, date of birth, ID band Patient awake    Reviewed: Allergy & Precautions, H&P , NPO status , Patient's Chart, lab work & pertinent test results  History of Anesthesia Complications Negative for: history of anesthetic complications  Airway Mallampati: III  TM Distance: <3 FB Neck ROM: full    Dental  (+) Poor Dentition, Chipped   Pulmonary neg shortness of breath, asthma ,    Pulmonary exam normal breath sounds clear to auscultation       Cardiovascular Exercise Tolerance: Good (-) angina(-) Past MI and (-) DOE negative cardio ROS Normal cardiovascular exam Rhythm:regular Rate:Normal     Neuro/Psych  Headaches, PSYCHIATRIC DISORDERS Anxiety    GI/Hepatic Neg liver ROS, GERD  Controlled,  Endo/Other  negative endocrine ROS  Renal/GU      Musculoskeletal negative musculoskeletal ROS (+)   Abdominal   Peds  Hematology negative hematology ROS (+)   Anesthesia Other Findings Past Medical History: No date: Acute superficial gastritis without hemorrhage No date: Anxiety No date: GERD (gastroesophageal reflux disease)     Comment: better since surgery No date: Headache No date: Mild exercise-induced asthma     Comment: Triggered by cold air or scents,  No date: Muscular abdominal pain in left flank No date: Musculoskeletal chest pain No date: Other fatigue No date: Unrefreshed by sleep  Past Surgical History: 2003: COLONOSCOPY     Comment: normal 2003: LAPAROSCOPY     Comment: acid reflux surgery No date: TONSILLECTOMY No date: TONSILLECTOMY  BMI    Body Mass Index:  27.12 kg/m      Reproductive/Obstetrics negative OB ROS                             Anesthesia Physical  Anesthesia Plan  ASA: II  Anesthesia Plan: General   Post-op Pain Management:    Induction:   Airway Management Planned: Oral ETT  Additional  Equipment:   Intra-op Plan:   Post-operative Plan: Extubation in OR  Informed Consent: I have reviewed the patients History and Physical, chart, labs and discussed the procedure including the risks, benefits and alternatives for the proposed anesthesia with the patient or authorized representative who has indicated his/her understanding and acceptance.   Dental Advisory Given  Plan Discussed with: Anesthesiologist, CRNA and Surgeon  Anesthesia Plan Comments:         Anesthesia Quick Evaluation

## 2016-04-10 NOTE — Anesthesia Procedure Notes (Signed)
Procedure Name: Intubation Date/Time: 04/10/2016 10:29 AM Performed by: [facility] Pre-anesthesia Checklist: Emergency Drugs available, Patient identified, Suction available, Patient being monitored and Timeout performed Patient Re-evaluated:Patient Re-evaluated prior to inductionOxygen Delivery Method: Circle system utilized Preoxygenation: Pre-oxygenation with 100% oxygen Intubation Type: IV induction Ventilation: Mask ventilation without difficulty Laryngoscope Size: Miller and 2 Grade View: Grade II Tube type: Oral Number of attempts: 1 Airway Equipment and Method: Patient positioned with wedge pillow and Stylet Secured at: 21 cm Tube secured with: Tape Dental Injury: Teeth and Oropharynx as per pre-operative assessment  Difficulty Due To: Difficulty was anticipated and Difficult Airway- due to anterior larynx Future Recommendations: Recommend- induction with short-acting agent, and alternative techniques readily available

## 2016-04-10 NOTE — H&P (View-Only) (Signed)
Subjective:  PREOPERATIVE HISTORY AND PHYSICAL   Date of surgery: 04/10/2016 Procedure: LAVH bilateral salpingectomy Diagnosis: 1. Abnormal uterine bleeding 2. 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 Uterine fibroids 4. Status post endometrial ablation   Patient is a 46 y.o. G0P0047female scheduled for LAVH bilateral salpingectomy on 04/10/2016. She is experiencing abnormal uterine bleeding following NovaSure endometrial ablation. She has a known history of uterine fibroids and history of simple endometrial hyperplasia without atypia. Preoperative endometrial biopsy demonstrated this diagnosis; the D&C during the endometrial ablation also demonstrated simple hyperplasia without atypia. Because of the patient's persistent abnormal uterine bleeding, she desires to proceed with surgery at this time.  Pertinent Gynecological History: No LMP recorded (lmp unknown). Contraception: vasectomy              OB History    Gravida Para Term Preterm AB Living   0 0 0 0 0 0   SAB TAB Ectopic Multiple Live Births   0 0 0 0 0     No LMP recorded (lmp unknown).    Past Medical History:  Diagnosis Date  . Acute superficial gastritis without hemorrhage   . Anxiety   . GERD (gastroesophageal reflux disease)    better since surgery  . Headache   . Mild exercise-induced asthma    Triggered by cold air or scents,   . Muscular abdominal pain in left flank   . Musculoskeletal chest pain   . Other fatigue   . Unrefreshed by sleep     Past Surgical History:  Procedure Laterality Date  . COLONOSCOPY  2003   normal  . DILITATION & CURRETTAGE/HYSTROSCOPY WITH NOVASURE ABLATION N/A 01/10/2016   Procedure: DILATATION & CURETTAGE/HYSTEROSCOPY WITH NOVASURE ABLATION;  Surgeon: Brayton Mars, MD;  Location: [facility] ORS;  Service: Gynecology;  Laterality: N/A;  . LAPAROSCOPY  2003   acid reflux surgery  . TONSILLECTOMY    . TONSILLECTOMY      OB History  Gravida Para Term  Preterm AB Living  0 0 0 0 0 0  SAB TAB Ectopic Multiple Live Births  0 0 0 0 0        Social History   Social History  . Marital status: Married    Spouse name: N/A  . Number of children: N/A  . Years of education: N/A   Social History Main Topics  . Smoking status: Never Smoker  . Smokeless tobacco: Never Used  . Alcohol use 0.6 oz/week    1 Glasses of wine per week     Comment: occasional  . Drug use: No  . Sexual activity: Yes    Partners: Male    Birth control/ protection: Other-see comments     Comment: vasectomy   Other Topics Concern  . None   Social History Narrative  . None    Family History  Problem Relation Age of Onset  . Heart disease Mother   . Dementia Mother   . Heart attack Father   . Breast cancer Paternal Aunt 67  . Ovarian cancer Neg Hx   . Colon cancer Neg Hx   . Diabetes Neg Hx      (Not in a hospital admission)  No Known Allergies  Review of Systems Constitutional: No recent fever/chills/sweats Respiratory: No recent cough/bronchitis Cardiovascular: No chest pain Gastrointestinal: No recent nausea/vomiting/diarrhea Genitourinary: No UTI symptoms Hematologic/lymphatic:No history of coagulopathy or recent blood thinner use    Objective:    BP 130/85   Pulse 96  Ht 5' 5.5" (1.664 m)   Wt 163 lb 4.8 oz (74.1 kg)   LMP  (LMP Unknown)   BMI 26.76 kg/m   General:   Normal  Skin:   normal  HEENT:  Normal  Neck:  Supple without Adenopathy or Thyromegaly  Lungs:   Heart:              Breasts:   Abdomen:  Pelvis:  M/S   Extremeties:  Neuro:    clear to auscultation bilaterally   Normal without murmur   Not Examined   soft, non-tender; bowel sounds normal; no masses,  no organomegaly   Exam deferred to OR  No CVAT  Warm/Dry   Normal          02/22/2016 Pelvic exam: External genitalia normal BUS-normal Vagina-Brownish red blood in vaginal vault Cervix-no lesions; no cervical motion  tenderness Uterus-midplane, normal size and shape, mobile, nontender Adnexa-nonpalpable and nontender    Assessment:    1. Abnormal uterine bleeding refractory to medical and surgical therapy 2. Status post NovaSure endometrial ablation with D&C 3. 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 She is understanding of the planned procedure and is aware of and is accepting of all surgical risks which include but are not limited to bleeding, infection, pelvic organ injury with need for repair, blood clot disorders, anesthesia risks, etc. All questions have been answered. Informed consent is given. Patient is ready and willing to proceed with surgery as scheduled.  Brayton Mars, MD  Note: This dictation was prepared with Dragon dictation along with smaller phrase technology. Any transcriptional errors that result from this process are unintentional.

## 2016-04-11 ENCOUNTER — Telehealth: Payer: Self-pay | Admitting: Obstetrics and Gynecology

## 2016-04-11 DIAGNOSIS — D259 Leiomyoma of uterus, unspecified: Secondary | ICD-10-CM | POA: Diagnosis not present

## 2016-04-11 LAB — SURGICAL PATHOLOGY

## 2016-04-11 LAB — HEMOGLOBIN: Hemoglobin: 12.6 g/dL (ref 12.0–16.0)

## 2016-04-11 MED ORDER — IBUPROFEN 800 MG PO TABS: 800.0000 mg | ORAL_TABLET | Freq: Three times a day (TID) | ORAL | 1 refills | Status: DC

## 2016-04-11 MED ORDER — OXYCODONE-ACETAMINOPHEN 5-325 MG PO TABS: 1.0000 | ORAL_TABLET | ORAL | 0 refills | Status: DC | PRN

## 2016-04-11 MED ORDER — DOCUSATE SODIUM 100 MG PO CAPS: 100.0000 mg | ORAL_CAPSULE | Freq: Two times a day (BID) | ORAL | 0 refills | Status: DC

## 2016-04-11 NOTE — Op Note (Signed)
OPERATIVE NOTE:  Nisreen L Silvers PROCEDURE DATE: 04/11/2016   PREOPERATIVE DIAGNOSIS:  1. Abnormal uterine bleeding 2. Uterine fibroids 3. Status post endometrial ablation  POSTOPERATIVE DIAGNOSIS:  Same as above  PROCEDURE:  LAVH bilateral salpingectomy  SURGEON:  Brayton Mars, MD ASSISTANTS: NP-S Evette Georges and Dr. Marcelline Mates ANESTHESIA: General INDICATIONS: 46 y.o. G0P0000 with chronic abnormal uterine bleeding and pelvic pain which has been refractory to conservative medical and surgical therapies. Patient desires definitive treatment through hysterectomy.  FINDINGS:  Multi-fibroid uterus; grossly normal ovaries and tubes   I/O's: Total I/O In: 240 [P.O.:240] Out: 450 [Urine:450] COUNTS:  YES SPECIMENS: Uterus with cervix and fallopian tubes ANTIBIOTIC PROPHYLAXIS:Ancef 2 grams COMPLICATIONS: None immediate  PROCEDURE IN DETAIL: Patient was brought to the operating room and placed in the supine position. General endotracheal anesthesia was induced without difficulty. She was placed in the dorsal lithotomy position using the bumblebee stirrups. A ChloraPrep and Hibiclens abdominal perineal intravaginal prep and drape was performed in standard fashion. Timeout was performed. Foley catheter was placed in the bladder and was draining clear yellow urine. A Hulka tenaculum was placed onto the cervix to facilitate uterine manipulation. Subumbilical vertical incision 5 mm in length was made. The Optiview laparoscopic trocar system was used to place a 5 mm port directly into the abdominal pelvic cavity without evidence of bowel or vascular injury. 2 other 5 mm ports were placed in the right and left lower quadrants respectively under direct visualization. Using Ace Harmonic scalpel along with graspers the hysterectomy was performed in standard fashion. The round ligaments were clamped coagulated and cut. The mesosalpinx was grasped coagulated and cut sequentially down to the  level of the uterine cornua. The fallopian tube was transected and removed from the operative field through the 5 mm port. Similar procedure was carried out on the contralateral tube. The remainder of the laparoscopic portion of hysterectomy was then performed using the Ace Harmonic scalpel. The cardinal broad ligament complexes were coagulated and cut with care to avoid the course of the ureter. The anterior leaf the broad ligament was opened and the bladder flap was created through sharp and blunt dissection. The uterine vessels were identified and coagulated using Kleppinger bipolar forceps. These vessels were then transected with the Harmonic scalpel. Following completion of the abdominal portion of the hysterectomy, attention was turned to the vaginal aspect of the procedure. Weighted speculum was placed in the vagina. The Hulka tenaculum was removed and a double-tooth tenaculum was placed onto the cervix to facilitate manipulation. Posterior colpotomy was made with Mayo scissors. Uterosacral ligaments were clamped cut and stick tied using 0 Vicryl suture. The remainder of the cardinal broad ligament complexes were clamped coagulated and cut followed by suture ligatures of 0 Vicryl. The bladder was dissected off the lower uterine segment through sharp and blunt dissection. Eventually the anterior colpotomy was made. Once all pedicles were taken down, the uterus was removed from the operative field. The posterior cuff was run using a 0 Vicryl suture in a baseball stitch technique. The vagina was then closed using simple interrupted technique of 2-0 chromic suture. Upon completion of the procedure all instrumentation was removed from the vagina. Laparoscopy was then performed to verify hemostasis within the pelvis. Kleppinger bipolar forceps were used to cauterize a slightly oozing peritoneal edge on the right side of the vaginal cuff. Irrigation of the pelvis was performed and the irrigant fluid was aspirated.  Once satisfied with inspection of the pelvis, procedure was terminated. All  instrumentation was removed from the abdominal pelvic cavity. Pneumoperitoneum was released. Incisions were closed with Dermabond glue. The patient was then awakened mobilized and taken to recovery room in satisfactory condition.  Kamesha Herne A. Zipporah Plants, MD, ACOG [facility]

## 2016-04-11 NOTE — Discharge Summary (Signed)
Physician Discharge Summary  Patient ID: Dana Fitzpatrick MRN: [id number] DOB/AGE: 1970/11/10 46 y.o.  Admit date: 04/10/2016 Discharge date: 04/11/2016  Admission Diagnoses: AUB and Fibroids  Discharge Diagnoses:  SAA  Operative Procedures: Procedure(s): LAPAROSCOPIC ASSISTED VAGINAL HYSTERECTOMY WITH BILATERAL SALPINGECTOMY (Bilateral)  Hospital Course: Uncomplicated.   Significant Diagnostic Studies:  Lab Results  Component Value Date   HGB 12.6 04/11/2016   HGB 14.7 04/06/2016   HGB 14.8 12/30/2015   Lab Results  Component Value Date   HCT 42.9 04/06/2016   HCT 44.0 12/30/2015   HCT 42.3 11/02/2015   CBC Latest Ref Rng & Units 04/11/2016 04/06/2016 12/30/2015  WBC 3.6 - 11.0 K/uL - 7.4 7.2  Hemoglobin 12.0 - 16.0 g/dL 12.6 14.7 14.8  Hematocrit 35.0 - 47.0 % - 42.9 44.0  Platelets 150 - 440 K/uL - 328 314     Discharged Condition: good  Discharge Exam: Blood pressure (!) 115/59, pulse 100, temperature 100 F (37.8 C), temperature source Oral, resp. rate 18, height 5' 5.5" (1.664 m), weight 163 lb (73.9 kg), SpO2 97 %. Incision/Wound: clean, dry and no drainage  Disposition: 01-Home or Self Care  Discharge Instructions    Discharge patient    Complete by:  As directed    Discharge disposition:  01-Home or Self Care   Discharge patient date:  04/11/2016     Allergies as of 04/11/2016   No Known Allergies     Medication List    STOP taking these medications   medroxyPROGESTERone 10 MG tablet Commonly known as:  PROVERA     TAKE these medications   albuterol 108 (90 Base) MCG/ACT inhaler Commonly known as:  VENTOLIN HFA Inhale 1-2 puffs into the lungs every 6 (six) hours as needed.   BC HEADACHE PO Take 1 packet by mouth daily as needed (headaches).   cetirizine 10 MG tablet Commonly known as:  ZYRTEC Take 10 mg by mouth daily as needed for allergies.   cholecalciferol 1000 units tablet Commonly known as:  VITAMIN D Take 1,000 Units by mouth  daily.   cyclobenzaprine 5 MG tablet Commonly known as:  FLEXERIL Take 1-2 tablets by mouth at bedtime as needed for muscle spasms.   docusate sodium 100 MG capsule Commonly known as:  COLACE Take 1 capsule (100 mg total) by mouth 2 (two) times daily.   ibuprofen 800 MG tablet Commonly known as:  ADVIL,MOTRIN Take 1 tablet (800 mg total) by mouth 3 (three) times daily. What changed:  when to take this  reasons to take this   oxyCODONE-acetaminophen 5-325 MG tablet Commonly known as:  PERCOCET/ROXICET Take 1-2 tablets by mouth every 4 (four) hours as needed (moderate to severe pain (when tolerating fluids)).   sodium chloride 0.65 % Soln nasal spray Commonly known as:  OCEAN Place 1 spray into both nostrils as needed for congestion.   venlafaxine XR 75 MG 24 hr capsule Commonly known as:  EFFEXOR-XR Take 1 capsule (75 mg total) by mouth daily with breakfast.      Follow-up Information    Brayton Mars, MD Follow up in 1 week(s).   Specialties:  Obstetrics and Gynecology, Radiology Why:  Post Op Check Contact information: [place] [address] [phone]           Signed: Alanda Slim Aniqa Hare 04/11/2016, 8:06 AM

## 2016-04-11 NOTE — Progress Notes (Signed)
Discharge order from [facility]. Reviewed discharge instructions and prescriptions with pt and answered any questions. Pt discharged via wheelchair with husband.

## 2016-04-12 ENCOUNTER — Telehealth: Payer: Self-pay | Admitting: Obstetrics and Gynecology

## 2016-04-12 NOTE — Telephone Encounter (Signed)
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

## 2016-04-12 NOTE — Telephone Encounter (Signed)
Pt would like to go back to work 1/2 days starting tomorrow. Note faxed with confirmation.

## 2016-04-18 ENCOUNTER — Ambulatory Visit (INDEPENDENT_AMBULATORY_CARE_PROVIDER_SITE_OTHER): Payer: BC Managed Care – PPO | Admitting: Obstetrics and Gynecology

## 2016-04-18 ENCOUNTER — Encounter: Payer: Self-pay | Admitting: Obstetrics and Gynecology

## 2016-04-18 VITALS — BP 124/78 | HR 87 | Ht 65.5 in | Wt 164.5 lb

## 2016-04-18 DIAGNOSIS — Z90710 Acquired absence of both cervix and uterus: Secondary | ICD-10-CM

## 2016-04-18 DIAGNOSIS — Z09 Encounter for follow-up examination after completed treatment for conditions other than malignant neoplasm: Secondary | ICD-10-CM

## 2016-04-18 DIAGNOSIS — N8501 Benign endometrial hyperplasia: Secondary | ICD-10-CM

## 2016-04-18 DIAGNOSIS — Z9889 Other specified postprocedural states: Secondary | ICD-10-CM

## 2016-04-18 DIAGNOSIS — D259 Leiomyoma of uterus, unspecified: Secondary | ICD-10-CM

## 2016-04-18 NOTE — Patient Instructions (Signed)
1. Continue with routine postoperative precautions 2. Return in 5 weeks for final postop check

## 2016-04-18 NOTE — Progress Notes (Signed)
Chief complaint: 1. One week postop check 2. Status post LAVH bilateral salpingectomy  Patient is doing well. Bowel and bladder function are normal. She does have some mild abdominal soreness. She is not taking any narcotics for pain. She would like to return to part-time work.  PATHOLOGY: DIAGNOSIS:  A. UTERUS WITH CERVIX AND BILATERAL FALLOPIAN TUBES; HYSTERECTOMY WITH  BILATERAL SALPINGECTOMY:  - ACUTE ENDOCERVICITIS.  - FOCAL BENIGN ENDOMETRIAL HYPERPLASIA.  - ENDOMETRIUM WITH PROGESTIN EFFECT AND CHANGES CONSISTENT WITH PREVIOUS  SURGICAL PROCEDURE.  - LEIOMYOMATA, UP TO 1.0 CM; WITHOUT ATYPIA, NECROSIS OR INCREASED  MITOSES.  - PARATUBAL CYST, UNILATERAL FALLOPIAN TUBE.  - REMAINING FALLOPIAN TUBE WITHOUT PATHOLOGIC CHANGE.   OBJECTIVE: BP 124/78   Pulse 87   Ht 5' 5.5" (1.664 m)   Wt 164 lb 8 oz (74.6 kg)   LMP  (LMP Unknown)   BMI 26.96 kg/m  Pleasant female in no acute distress Abdomen: Soft, nontender; laparoscopy port sites are well approximated with Dermabond glue intact; there is. Incisional ecchymoses present at the 3 laparoscopy port sites. Extremities: Nontender; without edema  ASSESSMENT: 1. Normal postop check 1 week status post LAVH bilateral salpingectomy 2. History of benign endometrial hyperplasia; pathology from surgery demonstrated no precancer or cancer  PLAN: 1. Resume activities as tolerated 2. Patient may return to part-time work 3. Return in 5 weeks for final postop check  Brayton Mars, MD   Note: This dictation was prepared with Dragon dictation along with smaller phrase technology. Any transcriptional errors that result from this process are unintentional. .me

## 2016-04-20 NOTE — Telephone Encounter (Signed)
eroneous entry

## 2016-04-27 ENCOUNTER — Encounter: Payer: Self-pay | Admitting: Obstetrics and Gynecology

## 2016-04-27 ENCOUNTER — Telehealth: Payer: Self-pay

## 2016-04-27 NOTE — Telephone Encounter (Signed)
Pt aware per my chart message. Return to work note completed.

## 2016-05-01 ENCOUNTER — Ambulatory Visit: Payer: BC Managed Care – PPO | Admitting: Family Medicine

## 2016-05-23 ENCOUNTER — Encounter: Payer: Self-pay | Admitting: Family Medicine

## 2016-05-23 ENCOUNTER — Ambulatory Visit (INDEPENDENT_AMBULATORY_CARE_PROVIDER_SITE_OTHER): Payer: BC Managed Care – PPO | Admitting: Family Medicine

## 2016-05-23 ENCOUNTER — Ambulatory Visit (INDEPENDENT_AMBULATORY_CARE_PROVIDER_SITE_OTHER): Payer: BC Managed Care – PPO | Admitting: Obstetrics and Gynecology

## 2016-05-23 ENCOUNTER — Encounter: Payer: Self-pay | Admitting: Obstetrics and Gynecology

## 2016-05-23 VITALS — BP 119/78 | HR 85 | Ht 65.5 in | Wt 168.2 lb

## 2016-05-23 VITALS — BP 124/80 | HR 95 | Temp 98.0°F | Resp 16 | Wt 167.1 lb

## 2016-05-23 DIAGNOSIS — Z1231 Encounter for screening mammogram for malignant neoplasm of breast: Secondary | ICD-10-CM | POA: Diagnosis not present

## 2016-05-23 DIAGNOSIS — J309 Allergic rhinitis, unspecified: Secondary | ICD-10-CM | POA: Insufficient documentation

## 2016-05-23 DIAGNOSIS — Z90710 Acquired absence of both cervix and uterus: Secondary | ICD-10-CM

## 2016-05-23 DIAGNOSIS — N8501 Benign endometrial hyperplasia: Secondary | ICD-10-CM

## 2016-05-23 DIAGNOSIS — J301 Allergic rhinitis due to pollen: Secondary | ICD-10-CM | POA: Diagnosis not present

## 2016-05-23 DIAGNOSIS — J45990 Exercise induced bronchospasm: Secondary | ICD-10-CM | POA: Diagnosis not present

## 2016-05-23 DIAGNOSIS — Z09 Encounter for follow-up examination after completed treatment for conditions other than malignant neoplasm: Secondary | ICD-10-CM

## 2016-05-23 DIAGNOSIS — F411 Generalized anxiety disorder: Secondary | ICD-10-CM | POA: Diagnosis not present

## 2016-05-23 DIAGNOSIS — D259 Leiomyoma of uterus, unspecified: Secondary | ICD-10-CM

## 2016-05-23 DIAGNOSIS — Z1239 Encounter for other screening for malignant neoplasm of breast: Secondary | ICD-10-CM

## 2016-05-23 MED ORDER — ALBUTEROL SULFATE HFA 108 (90 BASE) MCG/ACT IN AERS: 1.0000 | INHALATION_SPRAY | RESPIRATORY_TRACT | 2 refills | Status: DC | PRN

## 2016-05-23 MED ORDER — VENLAFAXINE HCL ER 75 MG PO CP24: 75.0000 mg | ORAL_CAPSULE | Freq: Every day | ORAL | 3 refills | Status: DC

## 2016-05-23 NOTE — Progress Notes (Signed)
BP 124/80   Pulse 95   Temp 98 F (36.7 C) (Oral)   Resp 16   Wt 167 lb 2 oz (75.8 kg)   LMP  (LMP Unknown)   SpO2 96%   BMI 27.39 kg/m    Subjective:    Patient ID: Dana Fitzpatrick, female    DOB: [birth year], 46 y.o.   MRN: [id number]  HPI: [name] is a 46 y.o. female  Chief Complaint  Patient presents with  . Follow-up    6 month    Patient is here for f/u s/p hysterectomy, no cancer; sees Dr. Enzo Bi; path report benign; no fevers, no DVT; has the ibuprofen but not really using after surgery now Patient is due for refills of prescriptions Everything seems to be going well She is on effexor for anxiety; dose is working well; no side effects Used to use flexeril for muscles aches; not used in a long time (offered Rx, she's welcome to call later if needed) Allergies, uses zyrtec and uses prn, brand name works better Reactive airways; just needs SABA when cold  Depression screen [facility] 2/9 05/23/2016 11/02/2015 07/15/2015 04/09/2015 12/28/2014  Decreased Interest 0 0 0 0 0  Down, Depressed, Hopeless 0 0 0 0 0  PHQ - 2 Score 0 0 0 0 0   Relevant past medical, surgical, family and social history reviewed Past Medical History:  Diagnosis Date  . Acute superficial gastritis without hemorrhage   . Anxiety   . GERD (gastroesophageal reflux disease)    better since surgery  . Headache   . Mild exercise-induced asthma    Triggered by cold air or scents,   . Muscular abdominal pain in left flank   . Musculoskeletal chest pain   . Other fatigue   . Unrefreshed by sleep    Past Surgical History:  Procedure Laterality Date  . COLONOSCOPY  2003   normal  . DILITATION & CURRETTAGE/HYSTROSCOPY WITH NOVASURE ABLATION N/A 01/10/2016   Procedure: DILATATION & CURETTAGE/HYSTEROSCOPY WITH NOVASURE ABLATION;  Surgeon: Brayton Mars, MD;  Location: [facility] ORS;  Service: Gynecology;  Laterality: N/A;  . LAPAROSCOPIC VAGINAL HYSTERECTOMY WITH SALPINGECTOMY Bilateral  04/10/2016   Procedure: LAPAROSCOPIC ASSISTED VAGINAL HYSTERECTOMY WITH BILATERAL SALPINGECTOMY;  Surgeon: Brayton Mars, MD;  Location: [facility] ORS;  Service: Gynecology;  Laterality: Bilateral;  . LAPAROSCOPY  2003   acid reflux surgery  . TONSILLECTOMY    . TONSILLECTOMY     Family History  Problem Relation Age of Onset  . Heart disease Mother   . Dementia Mother   . Heart attack Father   . Breast cancer Paternal Aunt 85  . Ovarian cancer Neg Hx   . Colon cancer Neg Hx   . Diabetes Neg Hx    Social History  Substance Use Topics  . Smoking status: Never Smoker  . Smokeless tobacco: Never Used  . Alcohol use 0.6 oz/week    1 Glasses of wine per week     Comment: occasional    Interim medical history since last visit reviewed. Allergies and medications reviewed  Review of Systems Per HPI unless specifically indicated above     Objective:    BP 124/80   Pulse 95   Temp 98 F (36.7 C) (Oral)   Resp 16   Wt 167 lb 2 oz (75.8 kg)   LMP  (LMP Unknown)   SpO2 96%   BMI 27.39 kg/m   Wt Readings from Last 3 Encounters:  05/23/16 168  lb 3.2 oz (76.3 kg)  05/23/16 167 lb 2 oz (75.8 kg)  04/18/16 164 lb 8 oz (74.6 kg)    Physical Exam  Constitutional: She appears well-developed and well-nourished. No distress.  Weight gain 4 pounds over last 6 weeks  Eyes: EOM are normal. No scleral icterus.  Neck: No thyromegaly present.  Cardiovascular: Normal rate and regular rhythm.   Pulmonary/Chest: Effort normal and breath sounds normal.  Abdominal: Soft. Bowel sounds are normal. She exhibits no distension.  Musculoskeletal: Normal range of motion. She exhibits no edema.  Neurological: She is alert. She exhibits normal muscle tone.  Reflex Scores:      Patellar reflexes are 2+ on the right side and 2+ on the left side. Skin: Skin is warm and dry. She is not diaphoretic. No pallor.  Psychiatric: She has a normal mood and affect. Her behavior is normal. Judgment and thought  content normal.    Results for orders placed or performed during the hospital encounter of 04/10/16  Hemoglobin  Result Value Ref Range   Hemoglobin 12.6 12.0 - 16.0 g/dL  Pregnancy, urine POC  Result Value Ref Range   Preg Test, Ur NEGATIVE NEGATIVE  ABO/Rh  Result Value Ref Range   ABO/RH(D) O POS   Surgical pathology  Result Value Ref Range   SURGICAL PATHOLOGY      Surgical Pathology CASE: [id number] PATIENT: Dana Fitzpatrick Surgical Pathology Report     SPECIMEN SUBMITTED: A. Uterus with cervix, bilateral tubes  CLINICAL HISTORY: None provided  PRE-OPERATIVE DIAGNOSIS: Dysfunctional uterine bleeding, benign endometrial hyperplasia, uterine leiomyoma  POST-OPERATIVE DIAGNOSIS: Same as pre-op     DIAGNOSIS: A. UTERUS WITH CERVIX AND BILATERAL FALLOPIAN TUBES; HYSTERECTOMY WITH BILATERAL SALPINGECTOMY: - ACUTE ENDOCERVICITIS. - FOCAL BENIGN ENDOMETRIAL HYPERPLASIA. - ENDOMETRIUM WITH PROGESTIN EFFECT AND CHANGES CONSISTENT WITH PREVIOUS SURGICAL PROCEDURE. - LEIOMYOMATA, UP TO 1.0 CM; WITHOUT ATYPIA, NECROSIS OR INCREASED MITOSES. - PARATUBAL CYST, UNILATERAL FALLOPIAN TUBE. - REMAINING FALLOPIAN TUBE WITHOUT PATHOLOGIC CHANGE.   GROSS DESCRIPTION:  A. The specimen is received in a formalin-filled container labeled with the patient's name and uterus with cervix, bilateral tubes.  Adnexa: 2 unattached fa llopian tubes Weight: 93 grams Dimensions:      Fundus -5.2 x 5.1 x 4.4 cm      Cervix -3.6 x 3.8 cm Serosa: purple tan with focal fibrous adhesion and distorted by a 2.4 x 1.5 x 2.5 cm subserosal nodule Cervix: wrinkled pink-tan smooth Endocervix: trabecular pink-tan Endometrial cavity:      Dimensions -2.2 x 0.8 cm      Thickness -0.2 cm      Other findings -red focally yellow with contracture with irregular tracts Myometrium:     Thickness -2.2 cm     Other findings -3 intramural white whorled ranging from 0.4 and 1.0 cm in greatest  dimension and subserosal nodule is white whorled Adnexa:       First fallopian tube           Measurements -4.7 cm in length x 1.6 cm in diameter           Other findings -fimbriated purple       Second fallopian tube            Measurements - 6.1cm in length x 1.1 cm in diameter           Other findings -fimbriated with a 0.5 cm paratubal cyst Other comments: none noted  Block summary: 1-representative anterior cervix 2-re presentative posterior cervix 3-representative  anterior endomyometrium 4-representative posterior endomyometrium 5-representative white whorled nodules 6-representative cross-sections and longitudinal fimbriated end first fallopian tube 7-representative cross-sections and longitudinal fimbriated end second fallopian tube with cyst  Final Diagnosis performed by Delorse Lek, MD.  Electronically signed 04/11/2016 1:17:14PM    The electronic signature indicates that the named Attending Pathologist has evaluated the specimen  Technical component performed at [facility], [address] [place], [place] [address] Lab: [phone] Dir: Darrick Penna. Evette Doffing, MD  Professional component performed at [facility], [facility], [place], [place], [place] [address] Lab: [phone] Dir: Dellia Nims. Rubinas, MD        Assessment & Plan:   Problem List Items Addressed This Visit      Respiratory   Asthma, exercise induced    Reactive airways with exercise, cold; refill of SABA given      Relevant Medications   albuterol (VENTOLIN HFA) 108 (90 Base) MCG/ACT inhaler   Allergic rhinitis    Continue antihistamine        Other   GAD (generalized anxiety disorder) - Primary    Doing well on current dose of medicine; will continue, refills provided      Relevant Medications   venlafaxine XR (EFFEXOR-XR) 75 MG 24 hr capsule    Other Visit Diagnoses    Screening for breast cancer       Relevant Orders   MM Digital Screening       Follow up  plan: Return in about 1 year (around 05/23/2017) for medicine management.  An after-visit summary was printed and given to the patient at [facility].  Please see the patient instructions which may contain other information and recommendations beyond what is mentioned above in the assessment and plan.  Meds ordered this encounter  Medications  . venlafaxine XR (EFFEXOR-XR) 75 MG 24 hr capsule    Sig: Take 1 capsule (75 mg total) by mouth daily with breakfast.    Dispense:  90 capsule    Refill:  3  . albuterol (VENTOLIN HFA) 108 (90 Base) MCG/ACT inhaler    Sig: Inhale 1-2 puffs into the lungs every 4 (four) hours as needed.    Dispense:  18 g    Refill:  2    Orders Placed This Encounter  Procedures  . MM Digital Screening

## 2016-05-23 NOTE — Assessment & Plan Note (Signed)
Doing well on current dose of medicine; will continue, refills provided

## 2016-05-23 NOTE — Progress Notes (Signed)
Chief complaint: 1. Final postop check 2. Status post LAVH bilateral salpingectomy 3. History of benign endometrial hyperplasia and uterine fibroids  Dana Fitzpatrick presents for her final postop check 6 weeks after surgery. Bowel and bladder function are normal. She is not experiencing any pelvic pain. She is not experiencing any vaginal discharge or vaginal spotting. She has gone back to work part-time. She has not resumed intercourse.  OBJECTIVE: BP 119/78   Pulse 85   Ht 5' 5.5" (1.664 m)   Wt 168 lb 3.2 oz (76.3 kg)   LMP  (LMP Unknown)   BMI 27.56 kg/m  Pleasant female in no acute distress. Alert and oriented. Abdomen: Soft, nontender without organomegaly; laparoscopy incisions are well-healed Pelvic exam: External genitalia-normal BUS-normal Vagina-good vault support; vaginal cuff is intact with several small 3 mm areas of granulation present. Cervix-surgically absent Uterus-surgically absent Bimanual-appropriate cuff tenderness for postop state without induration or mass Adnexa-nonpalpable and nontender Rectovaginal-normal external exam  ASSESSMENT: 1. Normal final postop check 6 weeks status post LAVH bilateral salpingectomy 2. Pathology notable for benign endometrial hyperplasia and uterine fibroids 3. Minimal granulation tissue at cuff  PLAN: 1. Small activities without restriction 2. Monitor for any vaginal spotting or pelvic pain 3. Return in 6 months for follow-up  Brayton Mars, MD  Note: This dictation was prepared with Dragon dictation along with smaller phrase technology. Any transcriptional errors that result from this process are unintentional.

## 2016-05-23 NOTE — Patient Instructions (Signed)
1. Resume all activities without restriction 2. Return in 6 months for follow-up

## 2016-05-23 NOTE — Assessment & Plan Note (Signed)
Continue antihistamine 

## 2016-05-23 NOTE — Assessment & Plan Note (Signed)
Reactive airways with exercise, cold; refill of SABA given

## 2016-05-23 NOTE — Patient Instructions (Addendum)
Try sublingual vitamin B12 anything over 100 mcg daily, every 500 or 1000 mcg daily Return in 12 months, see gynecology for well woman exams

## 2016-06-02 IMAGING — CR DG CHEST 2V
1 series · 2 of 2 positions shown · non-contrast
Comparison: 04/12/2009.

CLINICAL DATA: Two week history of right sided anterior chest pain.
No known injury. Current history of exercise induced asthma.

EXAM:
CHEST  2 VIEW

[Series 1: kdxr chest pa (or ap) and lat · 0.14mm/px · 2 of 2 slices shown]
[im 1/2]
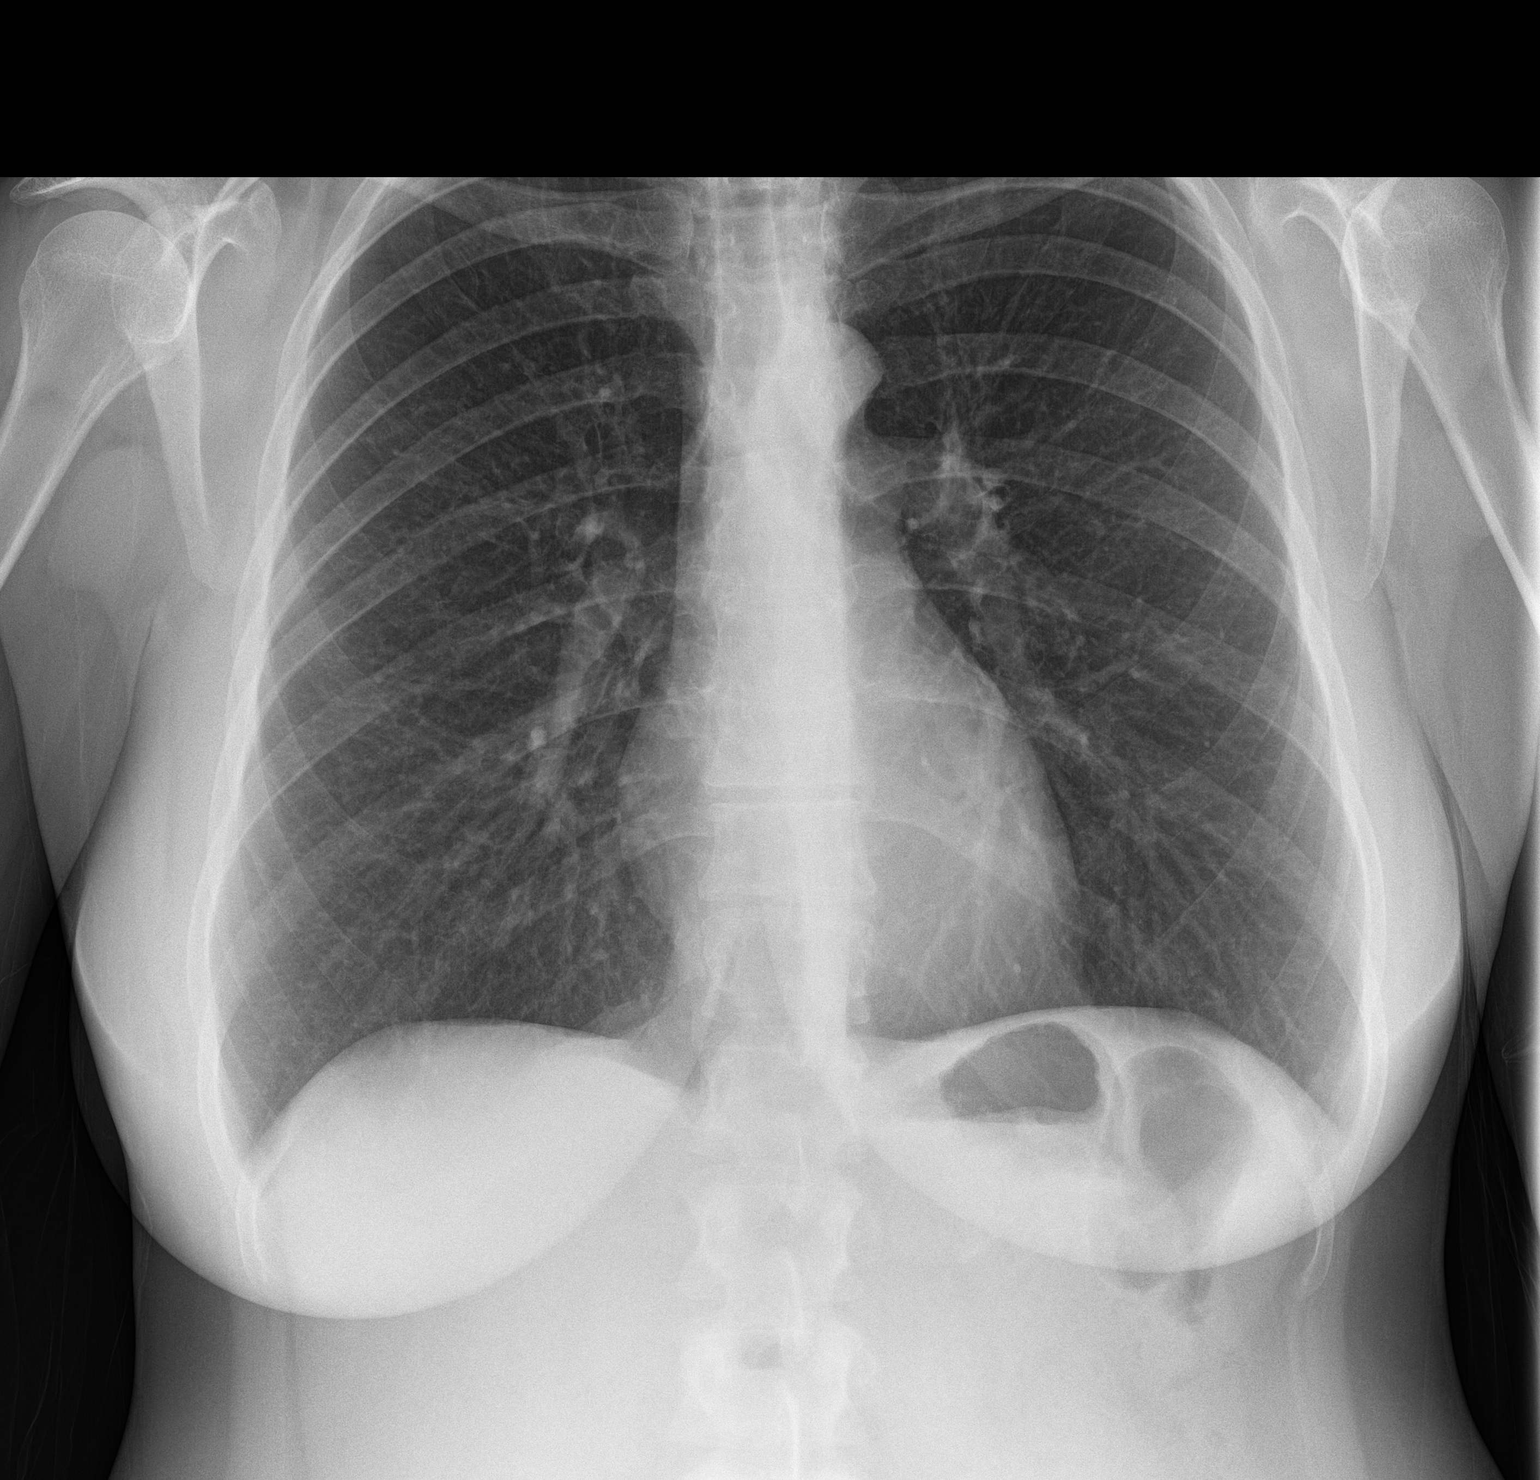
[im 2/2]
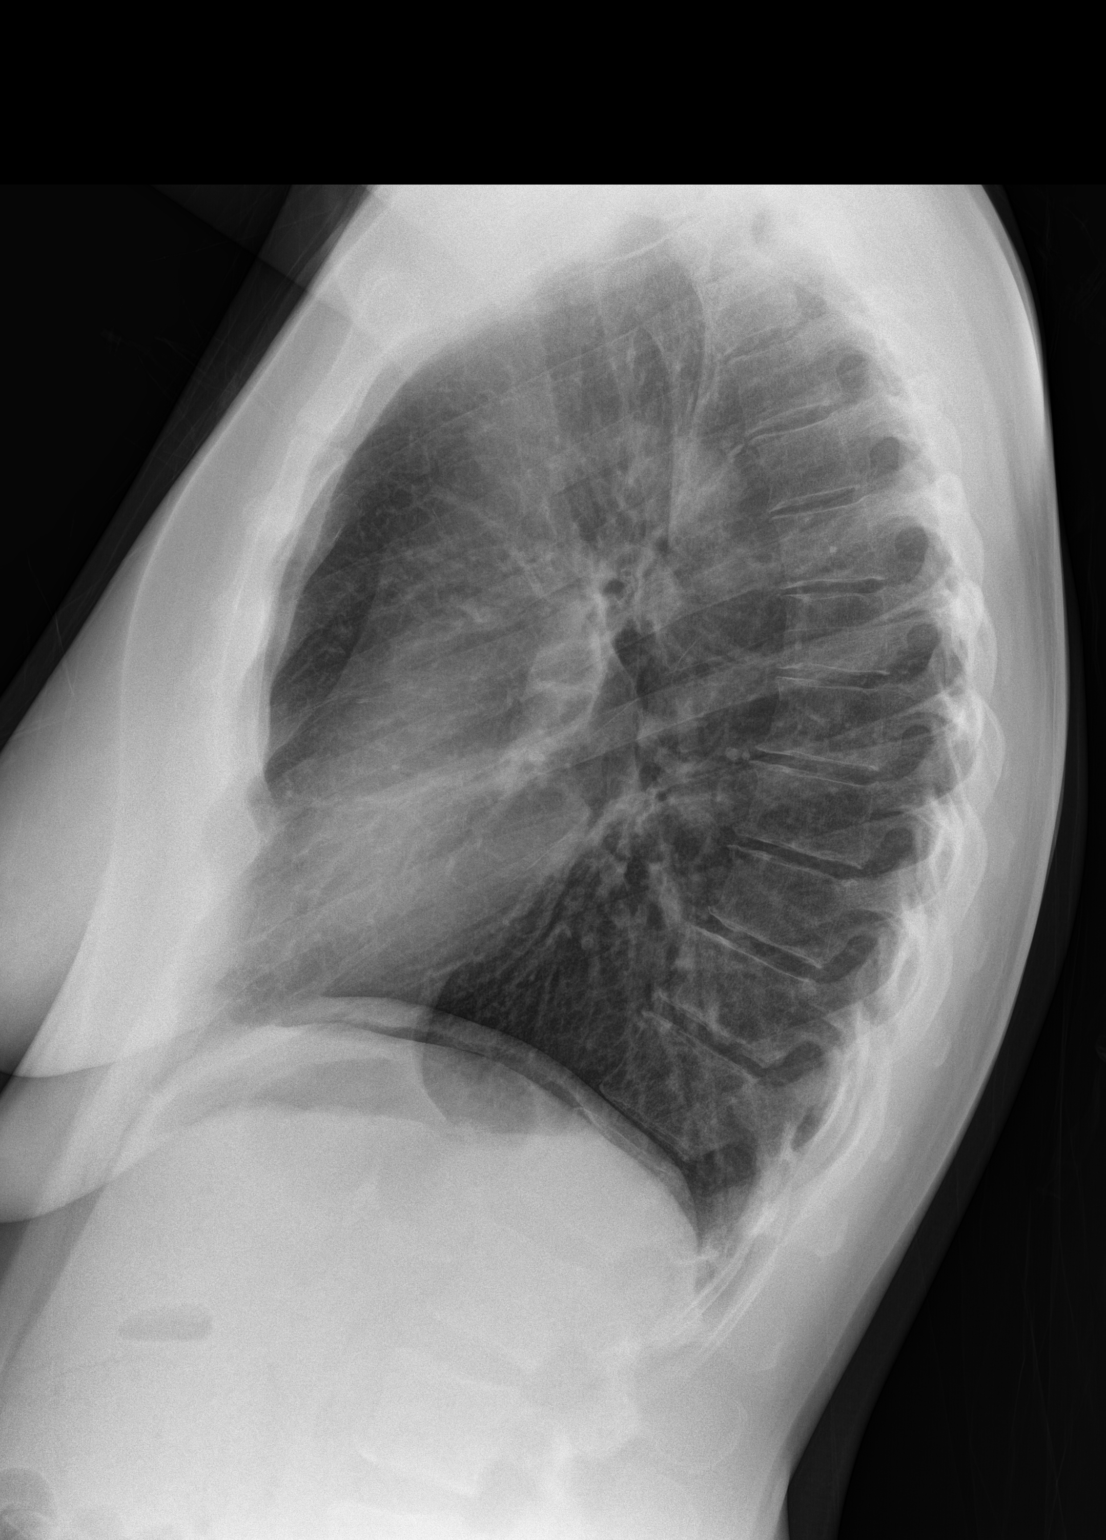

[2 of 2 positions shown; findings below may reference images not displayed]

FINDINGS: Cardiomediastinal silhouette unremarkable, unchanged. Lungs clear.
Bronchovascular markings normal. Pulmonary vascularity normal. No
visible pleural effusions. No pneumothorax. Mild degenerative
changes involving the thoracic spine. No significant interval
change.
IMPRESSION: No acute cardiopulmonary disease.  Stable examination.

## 2016-10-12 ENCOUNTER — Telehealth: Payer: Self-pay | Admitting: Family Medicine

## 2016-10-12 NOTE — Telephone Encounter (Signed)
Please follow-up with patient about the outstanding mammogram order We strongly encourage breast cancer screening Breast cancer is the 2nd leading cause of death in American women from cancer (#1 in Hispanic women in this country), and early detection saves lives Thank you 

## 2016-10-13 NOTE — Telephone Encounter (Signed)
Left detailed vociemail

## 2016-11-22 ENCOUNTER — Ambulatory Visit (INDEPENDENT_AMBULATORY_CARE_PROVIDER_SITE_OTHER): Payer: BC Managed Care – PPO | Admitting: Obstetrics and Gynecology

## 2016-11-22 ENCOUNTER — Encounter: Payer: Self-pay | Admitting: Obstetrics and Gynecology

## 2016-11-22 VITALS — BP 124/85 | HR 88 | Ht 65.5 in | Wt 169.8 lb

## 2016-11-22 DIAGNOSIS — N898 Other specified noninflammatory disorders of vagina: Secondary | ICD-10-CM | POA: Diagnosis not present

## 2016-11-22 DIAGNOSIS — Z90710 Acquired absence of both cervix and uterus: Secondary | ICD-10-CM

## 2016-11-22 NOTE — Patient Instructions (Signed)
1. Return in March 2019 for annual exam with Melody Shambley 2. Granulation tissue resolved and all activities may be continued without restriction.

## 2016-11-22 NOTE — Progress Notes (Signed)
Chief complaint: 1. Follow-up on granulation tissue 2. Status post LAVH  Dana Fitzpatrick presents today for follow-up. She reports no significant vaginal bleeding, vaginal discharge, or pelvic pain since her last visit. She is having intercourse without symptoms.  Past medical history, past surgical history, problem list, medications, and allergies are reviewed  OBJECTIVE: BP 124/85   Pulse 88   Ht 5' 5.5" (1.664 m)   Wt 169 lb 12.8 oz (77 kg)   LMP  (LMP Unknown)   BMI 27.83 kg/m  Pleasant female in no acute distress. She is alert and oriented. Abdomen: Soft, nontender without organomegaly. Bladder: Nontender Pelvic exam: External genitalia normal BUS-normal Vagina-good vault support; vaginal cuff intact without evidence of granulation tissue Cervix-surgically absent Uterus-surgically absent Bimanual-no palpable masses or tenderness Rectovaginal-normal external exam  ASSESSMENT: 1. Status post LAVH 8 months ago, doing well 2. Resolution of vaginal granulation tissue noted to  PLAN: 1. Resume all activities without restriction 2. Return to see Lorelle Gibbs for annual physical in March 2019  A total of 15 minutes were spent face-to-face with the patient during this encounter and over half of that time dealt with counseling and coordination of care.  Brayton Mars, MD  Note: This dictation was prepared with Dragon dictation along with smaller phrase technology. Any transcriptional errors that result from this process are unintentional.

## 2017-01-30 ENCOUNTER — Telehealth: Payer: Self-pay | Admitting: Family Medicine

## 2017-01-30 NOTE — Telephone Encounter (Signed)
Please urge patient to get her mammogram done as soon as possible This was due in 2017, so we'd really love if she could close out 2018 by having a mammogram done and crossed off of her list of things to do for her health After the Christmas holidays is fine, but we really encourage her to get this done Thank you

## 2017-02-02 NOTE — Telephone Encounter (Signed)
Called pt, no answer. LM for pt to schedule mammo, provided number to get scheduled.

## 2017-02-09 ENCOUNTER — Encounter: Payer: BC Managed Care – PPO | Admitting: Obstetrics and Gynecology

## 2017-03-21 ENCOUNTER — Ambulatory Visit
Admission: RE | Admit: 2017-03-21 | Discharge: 2017-03-21 | Disposition: A | Discharge status: Home / Self Care | Payer: BC Managed Care – PPO | Source: Ambulatory Visit | Attending: Family Medicine | Admitting: Family Medicine

## 2017-03-21 DIAGNOSIS — Z1231 Encounter for screening mammogram for malignant neoplasm of breast: Secondary | ICD-10-CM | POA: Diagnosis present

## 2017-03-21 HISTORY — DX: Malignant (primary) neoplasm, unspecified: C80.1

## 2017-04-11 IMAGING — US US BREAST LTD UNI RIGHT INC AXILLA
1 series · 11 of 11 positions shown · non-contrast
Comparison: Previous exam(s).

CLINICAL DATA: Screening recall for possible masses in the right
breast.

EXAM:
DIGITAL DIAGNOSTIC RIGHT MAMMOGRAM WITH 3D TOMOSYNTHESIS WITH CAD
ULTRASOUND RIGHT BREAST

[Series 1: us breast ltd uni right inc axilla · 0.07mm/px · 11 of 11 slices shown]
[im 1/11]
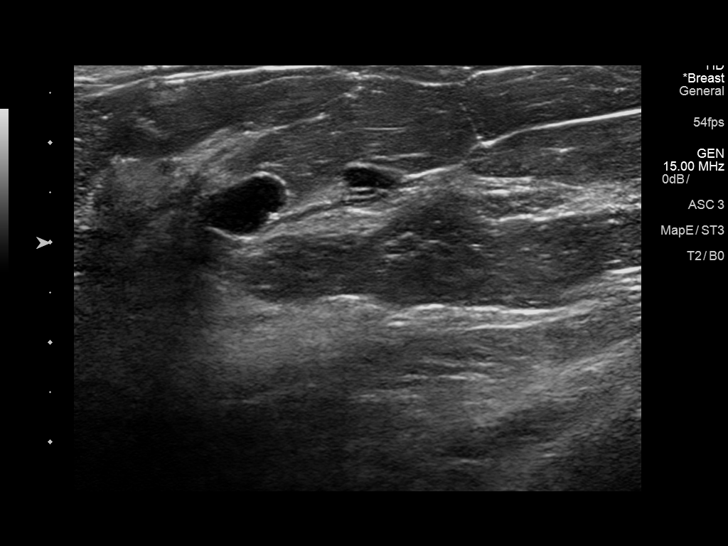
[im 2/11]
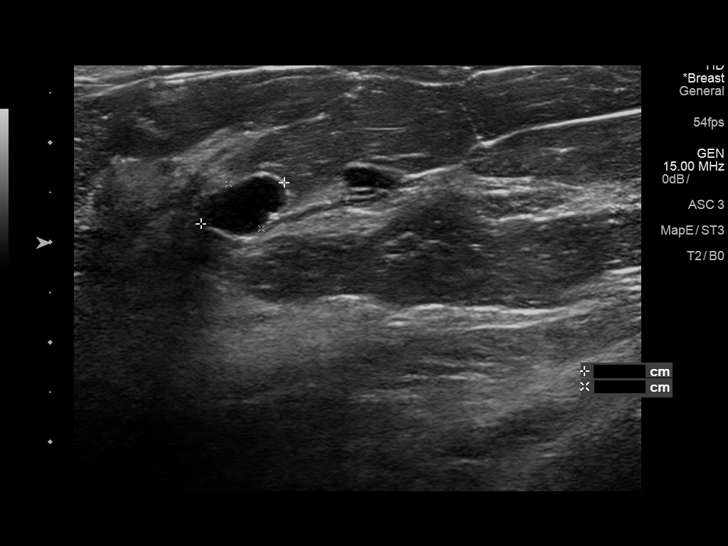
[im 3/11]
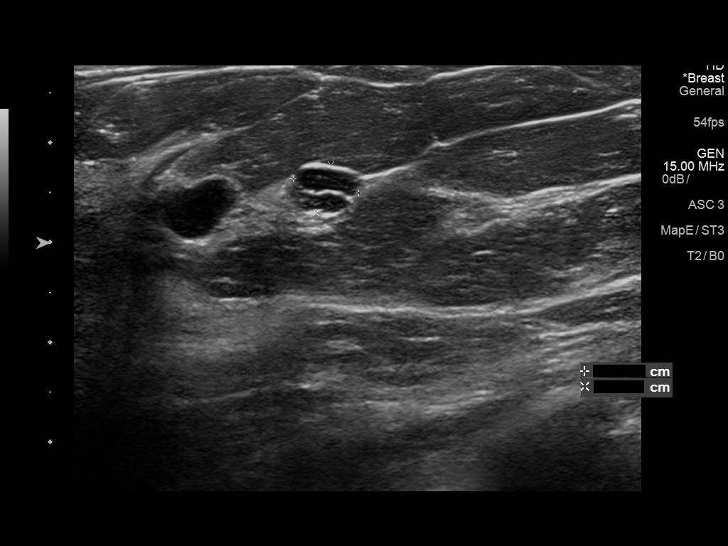
[im 4/11]
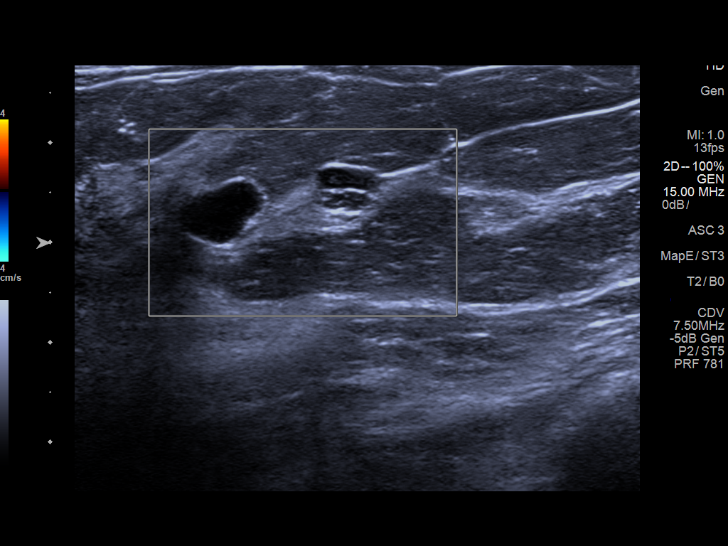
[im 5/11]
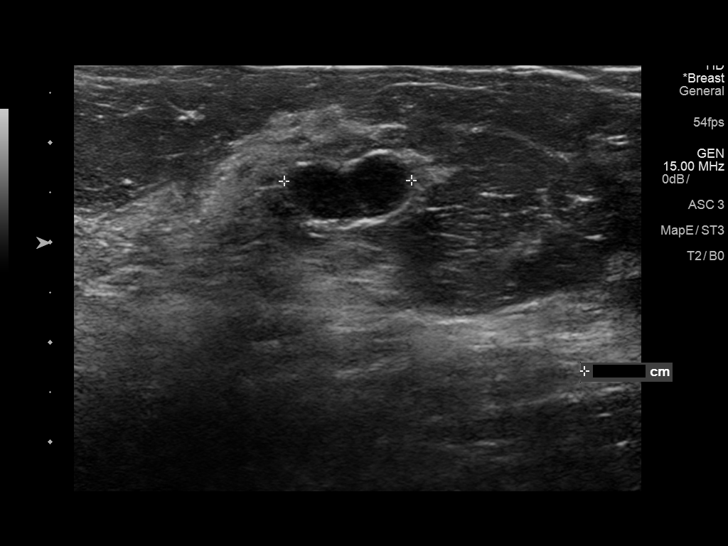
[im 6/11]
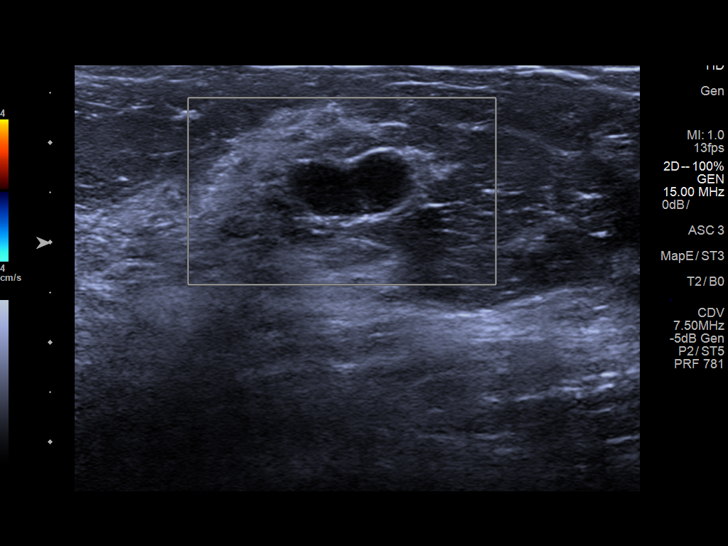
[im 7/11]
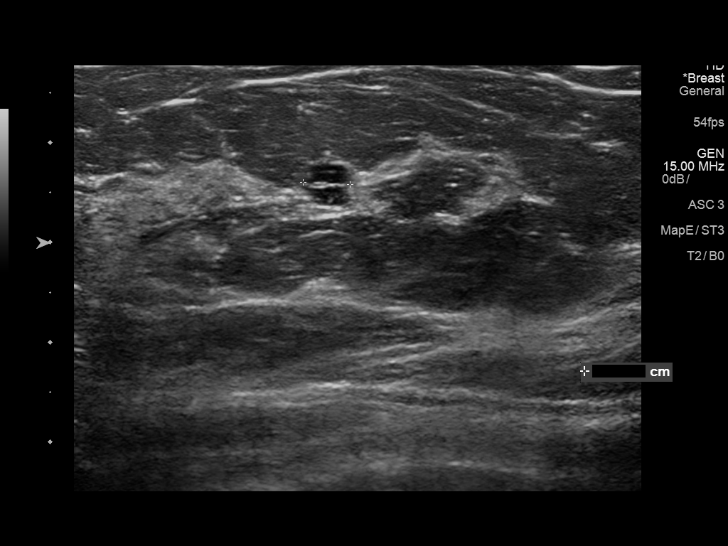
[im 8/11]
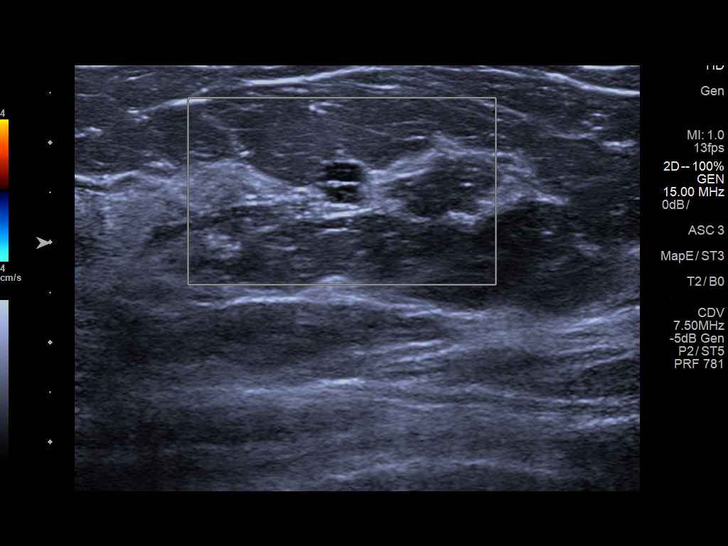
[im 9/11]
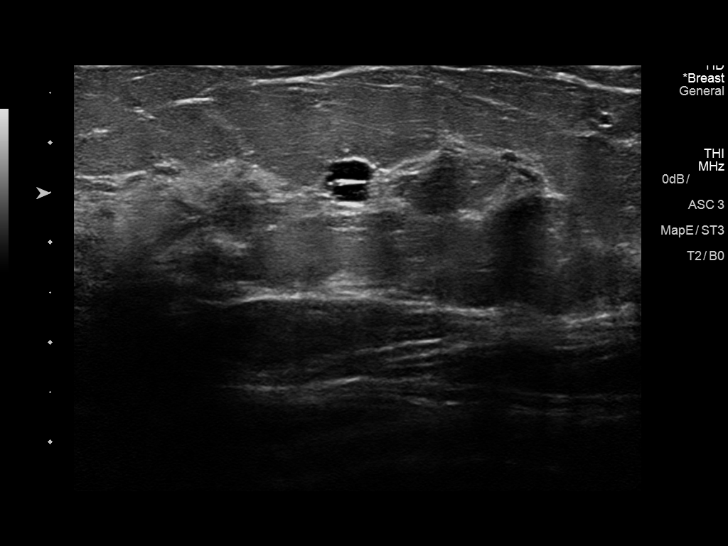
[im 10/11]
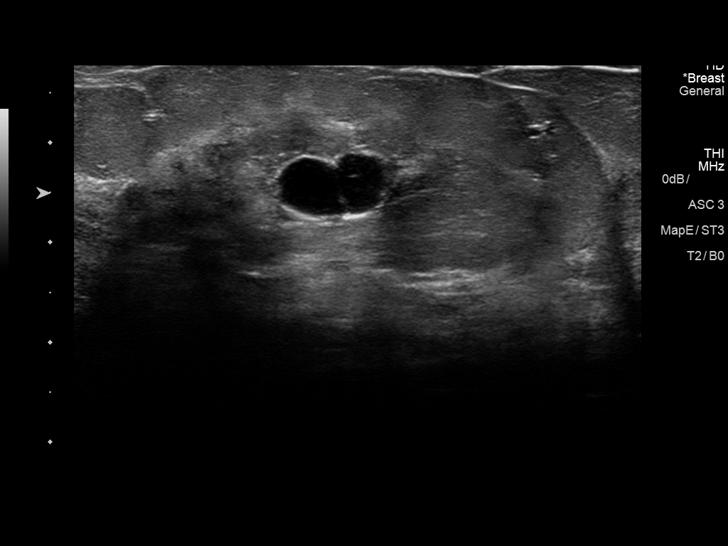
[im 11/11]
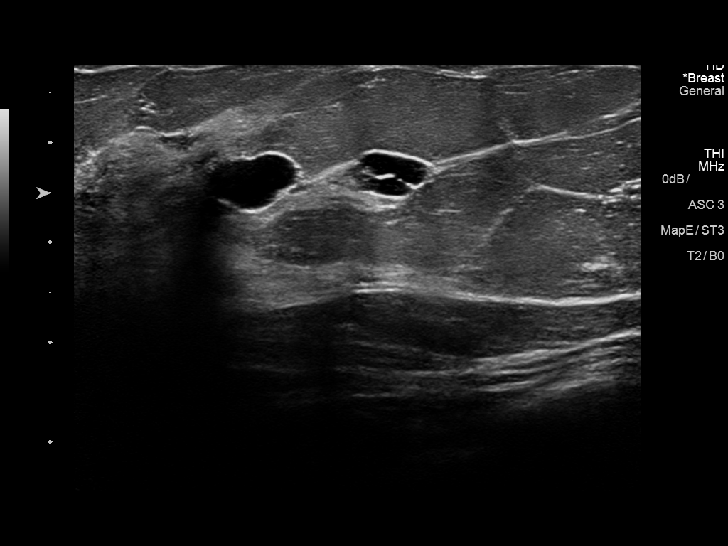

[11 of 11 positions shown; findings below may reference images not displayed]

ACR Breast Density Category c: The breast tissue is heterogeneously
dense, which may obscure small masses.
FINDINGS: The area of focal opacity in the posterior upper aspect of the right
appears as closely approximated fibroglandular tissue on the 3D
diagnostic images. This area is similar to prior studies.

More anterior area, in the medial aspect of the right breast,
appears as 2 or 3 adjacent oval circumscribed masses.

There are no areas of distortion. There are no suspicious
calcifications.

Mammographic images were processed with CAD.

On physical exam, no mass is palpated the medial right breast.

Targeted ultrasound is performed, showing 2 adjacent cysts in the 1
o'clock position of the right breast, 2 cm the nipple, 1 measuring
12 x 6 x 9 mm and the other measuring 7 x 5 x 5 mm. There are no
solid masses or suspicious lesions.
IMPRESSION: Benign right breast cysts.  No evidence of malignancy.

RECOMMENDATION:
Screening mammogram in one year.(Code:[id number])

I have discussed the findings and recommendations with the patient.
Results were also provided in writing at the conclusion of the
visit. If applicable, a reminder letter will be sent to the patient
regarding the next appointment.

BI-RADS CATEGORY  2: Benign.

## 2017-05-01 ENCOUNTER — Encounter: Payer: BC Managed Care – PPO | Admitting: Obstetrics and Gynecology

## 2017-05-22 ENCOUNTER — Ambulatory Visit: Payer: BC Managed Care – PPO | Admitting: Family Medicine

## 2017-05-22 ENCOUNTER — Encounter: Payer: Self-pay | Admitting: Family Medicine

## 2017-05-22 VITALS — BP 128/80 | HR 90 | Temp 98.4°F | Resp 14 | Ht 66.0 in | Wt 172.0 lb

## 2017-05-22 DIAGNOSIS — J301 Allergic rhinitis due to pollen: Secondary | ICD-10-CM | POA: Diagnosis not present

## 2017-05-22 DIAGNOSIS — F411 Generalized anxiety disorder: Secondary | ICD-10-CM

## 2017-05-22 DIAGNOSIS — J45990 Exercise induced bronchospasm: Secondary | ICD-10-CM

## 2017-05-22 DIAGNOSIS — M25552 Pain in left hip: Secondary | ICD-10-CM

## 2017-05-22 MED ORDER — VENLAFAXINE HCL ER 75 MG PO CP24
75.0000 mg | ORAL_CAPSULE | Freq: Every day | ORAL | 3 refills | Status: DC
Start: 2017-05-22 — End: 2018-05-20

## 2017-05-22 MED ORDER — ALBUTEROL SULFATE HFA 108 (90 BASE) MCG/ACT IN AERS: 1.0000 | INHALATION_SPRAY | RESPIRATORY_TRACT | 2 refills | Status: DC | PRN

## 2017-05-22 NOTE — Assessment & Plan Note (Signed)
Avoid decongestants; may continue plain antihistamine

## 2017-05-22 NOTE — Patient Instructions (Signed)
Try to use PLAIN allergy medicine without the decongestant Avoid: phenylephrine, phenylpropanolamine, and pseudoephredine

## 2017-05-22 NOTE — Progress Notes (Signed)
BP 128/80   Pulse 90   Temp 98.4 F (36.9 C) (Oral)   Resp 14   Ht 5\' 6"  (1.676 m)   Wt 172 lb (78 kg)   LMP  (LMP Unknown)   SpO2 94%   BMI 27.76 kg/m    Subjective:    Patient ID: Dana Fitzpatrick, female    DOB: December 12, 1970, 47 y.o.   MRN: [id number]  HPI: Dana Fitzpatrick is a 47 y.o. female  Chief Complaint  Patient presents with  . Follow-up  . Medication Refill    HPI Patient is here for f/u Allergies; not having a problem; pollen is strong right now; more seasonal, just not hitting it yet; avoiding decongestants  Anxiety; on medicine for that; it is much better than before she says; stress at work  Skin cancer history; seeing dermatologist for monitoring; just took something left arm, think it was cancer but they got it all  Mild asthma, exercise induced or cold air; using rescue inhaler very infrequently, just 1-2 x a week  Melody Trudee Kuster is doing well woman exams and labs; politely declined tetanus booster today; flu shot UTD  She thinks "old age" is setting in along the left hip; points along the front of the LEFT hip; just a little stiff  Depression screen [facility] 2/9 05/22/2017 05/23/2016 11/02/2015 07/15/2015 04/09/2015  Decreased Interest 0 0 0 0 0  Down, Depressed, Hopeless 0 0 0 0 0  PHQ - 2 Score 0 0 0 0 0    Relevant past medical, surgical, family and social history reviewed Past Medical History:  Diagnosis Date  . Acute superficial gastritis without hemorrhage   . Anxiety   . Cancer ([place])    skin ca  . GERD (gastroesophageal reflux disease)    better since surgery  . Headache   . Mild exercise-induced asthma    Triggered by cold air or scents,   . Muscular abdominal pain in left flank   . Musculoskeletal chest pain   . Other fatigue   . Unrefreshed by sleep    Past Surgical History:  Procedure Laterality Date  . COLONOSCOPY  2003   normal  . DILITATION & CURRETTAGE/HYSTROSCOPY WITH NOVASURE ABLATION N/A 01/10/2016   Procedure: DILATATION &  CURETTAGE/HYSTEROSCOPY WITH NOVASURE ABLATION;  Surgeon: Brayton Mars, MD;  Location: [facility] ORS;  Service: Gynecology;  Laterality: N/A;  . LAPAROSCOPIC VAGINAL HYSTERECTOMY WITH SALPINGECTOMY Bilateral 04/10/2016   Procedure: LAPAROSCOPIC ASSISTED VAGINAL HYSTERECTOMY WITH BILATERAL SALPINGECTOMY;  Surgeon: Brayton Mars, MD;  Location: [facility] ORS;  Service: Gynecology;  Laterality: Bilateral;  . LAPAROSCOPY  2003   acid reflux surgery  . TONSILLECTOMY    . TONSILLECTOMY     Family History  Problem Relation Age of Onset  . Heart disease Mother   . Dementia Mother   . Heart attack Father   . Breast cancer Paternal Aunt 45  . Ovarian cancer Neg Hx   . Colon cancer Neg Hx   . Diabetes Neg Hx    Social History   Tobacco Use  . Smoking status: Never Smoker  . Smokeless tobacco: Never Used  Substance Use Topics  . Alcohol use: Yes    Alcohol/week: 0.6 oz    Types: 1 Glasses of wine per week    Comment: occasional  . Drug use: No    Interim medical history since last visit reviewed. Allergies and medications reviewed  Review of Systems Per HPI unless specifically indicated above  Objective:    BP 128/80   Pulse 90   Temp 98.4 F (36.9 C) (Oral)   Resp 14   Ht 5\' 6"  (1.676 m)   Wt 172 lb (78 kg)   LMP  (LMP Unknown)   SpO2 94%   BMI 27.76 kg/m   Wt Readings from Last 3 Encounters:  05/22/17 172 lb (78 kg)  11/22/16 169 lb 12.8 oz (77 kg)  05/23/16 168 lb 3.2 oz (76.3 kg)    Physical Exam  Constitutional: She appears well-developed and well-nourished. No distress.  HENT:  Head: Normocephalic and atraumatic.  Eyes: EOM are normal. No scleral icterus.  Neck: No thyromegaly present.  Cardiovascular: Normal rate, regular rhythm and normal heart sounds.  No murmur heard. Pulmonary/Chest: Effort normal and breath sounds normal. No respiratory distress. She has no wheezes.  Abdominal: Soft. Bowel sounds are normal. She exhibits no distension.    Musculoskeletal: She exhibits no edema.       Left hip: She exhibits decreased range of motion and tenderness.  With external rotation in flexed position  Neurological: She is alert. She exhibits normal muscle tone.  Skin: Skin is warm and dry. She is not diaphoretic. No pallor.  Psychiatric: She has a normal mood and affect. Her behavior is normal. Her mood appears not anxious. She does not exhibit a depressed mood.      Assessment & Plan:   Problem List Items Addressed This Visit      Respiratory   Asthma, exercise induced   Relevant Medications   albuterol (VENTOLIN HFA) 108 (90 Base) MCG/ACT inhaler   Allergic rhinitis    Avoid decongestants; may continue plain antihistamine        Other   GAD (generalized anxiety disorder) - Primary   Relevant Medications   venlafaxine XR (EFFEXOR-XR) 75 MG 24 hr capsule    Other Visit Diagnoses    Hip pain, acute, left       likely tendonitis; arthritis in ddx as well; will try gentle stretching, videos on internet; if not improving, call for referral or med       Follow up plan: Return in about 1 year (around 05/23/2018) for follow-up visit with Dr. Sanda Klein.  An after-visit summary was printed and given to the patient at [place].  Please see the patient instructions which may contain other information and recommendations beyond what is mentioned above in the assessment and plan.  Meds ordered this encounter  Medications  . venlafaxine XR (EFFEXOR-XR) 75 MG 24 hr capsule    Sig: Take 1 capsule (75 mg total) by mouth daily with breakfast.    Dispense:  90 capsule    Refill:  3  . albuterol (VENTOLIN HFA) 108 (90 Base) MCG/ACT inhaler    Sig: Inhale 1-2 puffs into the lungs every 4 (four) hours as needed.    Dispense:  18 g    Refill:  2    No orders of the defined types were placed in this encounter.

## 2017-06-05 ENCOUNTER — Ambulatory Visit (INDEPENDENT_AMBULATORY_CARE_PROVIDER_SITE_OTHER): Payer: BC Managed Care – PPO | Admitting: Obstetrics and Gynecology

## 2017-06-05 ENCOUNTER — Other Ambulatory Visit: Payer: Self-pay | Admitting: Obstetrics and Gynecology

## 2017-06-05 ENCOUNTER — Encounter: Payer: Self-pay | Admitting: Obstetrics and Gynecology

## 2017-06-05 VITALS — BP 142/92 | HR 76 | Ht 66.0 in | Wt 170.1 lb

## 2017-06-05 DIAGNOSIS — Z01419 Encounter for gynecological examination (general) (routine) without abnormal findings: Secondary | ICD-10-CM | POA: Diagnosis not present

## 2017-06-05 DIAGNOSIS — Z23 Encounter for immunization: Secondary | ICD-10-CM

## 2017-06-05 MED ORDER — TETANUS-DIPHTH-ACELL PERTUSSIS 5-2.5-18.5 LF-MCG/0.5 IM SUSP
0.5000 mL | Freq: Once | INTRAMUSCULAR | Status: AC
  Administered 2017-06-05: 0.5 mL via INTRAMUSCULAR

## 2017-06-05 NOTE — Progress Notes (Signed)
Subjective:   Dana Fitzpatrick is a 47 y.o. G0P0000 Caucasian female here for a routine well-woman exam.  No LMP recorded (lmp unknown). Patient has had a hysterectomy.    Current complaints: none PCP: [facility]       Does need labs  Social History: Sexual: heterosexual Marital Status: married Living situation: with family Occupation: data entry Scientist, clinical (histocompatibility and immunogenetics) at [employer] Tobacco/alcohol: no tobacco use Illicit drugs: no history of illicit drug use  The following portions of the patient's history were reviewed and updated as appropriate: allergies, current medications, past family history, past medical history, past social history, past surgical history and problem list.  Past Medical History Past Medical History:  Diagnosis Date  . Acute superficial gastritis without hemorrhage   . Anxiety   . Cancer ([place])    skin ca  . GERD (gastroesophageal reflux disease)    better since surgery  . Headache   . Mild exercise-induced asthma    Triggered by cold air or scents,   . Muscular abdominal pain in left flank   . Musculoskeletal chest pain   . Other fatigue   . Unrefreshed by sleep     Past Surgical History Past Surgical History:  Procedure Laterality Date  . COLONOSCOPY  2003   normal  . DILITATION & CURRETTAGE/HYSTROSCOPY WITH NOVASURE ABLATION N/A 01/10/2016   Procedure: DILATATION & CURETTAGE/HYSTEROSCOPY WITH NOVASURE ABLATION;  Surgeon: Brayton Mars, MD;  Location: [facility] ORS;  Service: Gynecology;  Laterality: N/A;  . LAPAROSCOPIC VAGINAL HYSTERECTOMY WITH SALPINGECTOMY Bilateral 04/10/2016   Procedure: LAPAROSCOPIC ASSISTED VAGINAL HYSTERECTOMY WITH BILATERAL SALPINGECTOMY;  Surgeon: Brayton Mars, MD;  Location: [facility] ORS;  Service: Gynecology;  Laterality: Bilateral;  . LAPAROSCOPY  2003   acid reflux surgery  . TONSILLECTOMY    . TONSILLECTOMY      Gynecologic History G0P0000  No LMP recorded (lmp unknown). Patient has had a  hysterectomy. Contraception: status post hysterectomy Last Pap: 2014. Results were: normal Last mammogram: 03/2017. Results were: normal   Obstetric History OB History  Gravida Para Term Preterm AB Living  0 0 0 0 0 0  SAB TAB Ectopic Multiple Live Births  0 0 0 0 0    Current Medications Current Outpatient Medications on File Prior to Visit  Medication Sig Dispense Refill  . albuterol (VENTOLIN HFA) 108 (90 Base) MCG/ACT inhaler Inhale 1-2 puffs into the lungs every 4 (four) hours as needed. 18 g 2  . cetirizine (ZYRTEC) 10 MG tablet Take 10 mg by mouth daily as needed for allergies.    . cholecalciferol (VITAMIN D) 1000 units tablet Take 1,000 Units by mouth daily.    . Niacinamide-Zinc-Copper-FA (TL NICOTINAMIDE PO) Take 1 tablet by mouth daily.    Marland Kitchen venlafaxine XR (EFFEXOR-XR) 75 MG 24 hr capsule Take 1 capsule (75 mg total) by mouth daily with breakfast. 90 capsule 3  . Aspirin-Salicylamide-Caffeine (BC HEADACHE PO) Take 1 packet by mouth daily as needed (headaches).    Marland Kitchen ibuprofen (ADVIL,MOTRIN) 800 MG tablet Take 1 tablet (800 mg total) by mouth 3 (three) times daily. (Patient not taking: Reported on 06/05/2017) 50 tablet 1   No current facility-administered medications on file prior to visit.     Review of Systems Patient denies any headaches, blurred vision, shortness of breath, chest pain, abdominal pain, problems with bowel movements, urination, or intercourse.  Objective:  BP (!) 142/92   Pulse 76   Ht 5\' 6"  (1.676 m)   Wt 170 lb 1.6 oz (  77.2 kg)   LMP  (LMP Unknown)   BMI 27.45 kg/m  Physical Exam  General:  Well developed, well nourished, no acute distress. She is alert and oriented x3. Skin:  Warm and dry Neck:  Midline trachea, no thyromegaly or nodules Cardiovascular: Regular rate and rhythm, no murmur heard Lungs:  Effort normal, all lung fields clear to auscultation bilaterally Breasts:  No dominant palpable mass, retraction, or nipple  discharge Abdomen:  Soft, non tender, no hepatosplenomegaly or masses Pelvic:  External genitalia is normal in appearance.  The vagina is normal in appearance. The cervix is bulbous, no CMT.  Thin prep pap is done with HR HPV cotesting. Uterus is surgically absent  No adnexal masses or tenderness noted. Extremities:  No swelling or varicosities noted Psych:  She has a normal mood and affect  Assessment:   Healthy well-woman exam S/p hysterectomy Needs Tdap Overweight   Plan:  Labs obtained- will follow up accordingly Tdap given F/U 1 yea for AE, or sooner if needed   Melody Rockney Ghee, CNM

## 2017-06-06 LAB — LIPID PANEL
CHOL/HDL RATIO: 3.6 ratio (ref 0.0–4.4)
Cholesterol, Total: 196 mg/dL (ref 100–199)
HDL: 55 mg/dL (ref >39)
LDL Calculated: 117 mg/dL — ABNORMAL HIGH (ref 0–99)
Triglycerides: 118 mg/dL (ref 0–149)
VLDL Cholesterol Cal: 24 mg/dL (ref 5–40)

## 2017-06-06 LAB — COMPREHENSIVE METABOLIC PANEL
ALBUMIN: 4.4 g/dL (ref 3.5–5.5)
ALT: 14 IU/L (ref 0–32)
AST: 25 IU/L (ref 0–40)
Albumin/Globulin Ratio: 1.5 (ref 1.2–2.2)
Alkaline Phosphatase: 95 IU/L (ref 39–117)
BUN / CREAT RATIO: 13 (ref 9–23)
BUN: 9 mg/dL (ref 6–24)
Bilirubin Total: <0.2 mg/dL (ref 0.0–1.2)
CALCIUM: 9.2 mg/dL (ref 8.7–10.2)
CO2: 24 mmol/L (ref 20–29)
CREATININE: 0.7 mg/dL (ref 0.57–1.00)
Chloride: 99 mmol/L (ref 96–106)
GFR, EST AFRICAN AMERICAN: 119 mL/min/{1.73_m2} (ref >59)
GFR, EST NON AFRICAN AMERICAN: 104 mL/min/{1.73_m2} (ref >59)
GLUCOSE: 87 mg/dL (ref 65–99)
Globulin, Total: 3 g/dL (ref 1.5–4.5)
Potassium: 3.9 mmol/L (ref 3.5–5.2)
Sodium: 135 mmol/L (ref 134–144)
TOTAL PROTEIN: 7.4 g/dL (ref 6.0–8.5)

## 2017-06-06 LAB — CYTOLOGY - PAP

## 2017-11-12 ENCOUNTER — Ambulatory Visit: Payer: BC Managed Care – PPO | Admitting: Family Medicine

## 2017-11-12 ENCOUNTER — Encounter: Payer: Self-pay | Admitting: Family Medicine

## 2017-11-12 VITALS — BP 122/86 | HR 93 | Temp 98.1°F | Ht 66.0 in | Wt 177.5 lb

## 2017-11-12 DIAGNOSIS — R5383 Other fatigue: Secondary | ICD-10-CM | POA: Diagnosis not present

## 2017-11-12 DIAGNOSIS — R0609 Other forms of dyspnea: Secondary | ICD-10-CM

## 2017-11-12 DIAGNOSIS — M2550 Pain in unspecified joint: Secondary | ICD-10-CM

## 2017-11-12 DIAGNOSIS — R600 Localized edema: Secondary | ICD-10-CM

## 2017-11-12 NOTE — Progress Notes (Signed)
BP 122/86   Pulse 93   Temp 98.1 F (36.7 C)   Ht 5\' 6"  (1.676 m)   Wt 177 lb 8 oz (80.5 kg)   LMP  (LMP Unknown)   SpO2 98%   BMI 28.65 kg/m    Subjective:    Patient ID: Dana Fitzpatrick, female    DOB: Sep 30, 1970, 47 y.o.   MRN: [id number]  HPI: Dana Fitzpatrick is a 47 y.o. female  Chief Complaint  Patient presents with  . Fatigue    excessive fatigue with body aches, onset 2 weeks ago    HPI Patient is here for an acute visit She is having excessive fatigue Body aches Onset 2 weeks ago Was working a lot at her job; slept and slept and not just feeling sleepy, also achy Could just crawl up on the table and sleep No travel No one with similar symptoms No sore throat, no swollen glands in the neck No fevers, has been checking Achy Got flu shot last week, sx started before the flu shot Gaining weight, not feeling like exercising Appetite is fine Had to walk a couple blocks to go to her car, out of breath and ready to sit down A little swelling in her legs; at the end of the day, she can tell her legs are swollen Father died from a massive heart attack; he had HTN; no heart attacks, sudden at work, worked across from [employer]; he was 47 years old; others in the family have had heart attacks in their 81s; no personal issues No chest pain Aching in her joints, hands; mother had some form of arthritis, not sure what kind  Still taking venlafaxine; it's working well  Inhaler, used last week; exercise-induced bronchospasm and cold weather  Depression screen [facility] 2/9 11/12/2017 05/22/2017 05/23/2016 11/02/2015 07/15/2015  Decreased Interest 0 0 0 0 0  Down, Depressed, Hopeless 0 0 0 0 0  PHQ - 2 Score 0 0 0 0 0   Fall Risk  11/12/2017 05/22/2017 05/23/2016 11/02/2015 07/15/2015  Falls in the past year? No No No No No    Relevant past medical, surgical, family and social history reviewed Past Medical History:  Diagnosis Date  . Acute superficial gastritis without hemorrhage     . Anxiety   . Cancer ([place])    skin ca  . GERD (gastroesophageal reflux disease)    better since surgery  . Headache   . Mild exercise-induced asthma    Triggered by cold air or scents,   . Muscular abdominal pain in left flank   . Musculoskeletal chest pain   . Other fatigue   . Unrefreshed by sleep    Past Surgical History:  Procedure Laterality Date  . COLONOSCOPY  2003   normal  . DILITATION & CURRETTAGE/HYSTROSCOPY WITH NOVASURE ABLATION N/A 01/10/2016   Procedure: DILATATION & CURETTAGE/HYSTEROSCOPY WITH NOVASURE ABLATION;  Surgeon: Brayton Mars, MD;  Location: [facility] ORS;  Service: Gynecology;  Laterality: N/A;  . LAPAROSCOPIC VAGINAL HYSTERECTOMY WITH SALPINGECTOMY Bilateral 04/10/2016   Procedure: LAPAROSCOPIC ASSISTED VAGINAL HYSTERECTOMY WITH BILATERAL SALPINGECTOMY;  Surgeon: Brayton Mars, MD;  Location: [facility] ORS;  Service: Gynecology;  Laterality: Bilateral;  . LAPAROSCOPY  2003   acid reflux surgery  . TONSILLECTOMY    . TONSILLECTOMY     Family History  Problem Relation Age of Onset  . Heart disease Mother   . Dementia Mother   . Heart attack Father   . Breast cancer Paternal Aunt 52  .  Ovarian cancer Neg Hx   . Colon cancer Neg Hx   . Diabetes Neg Hx    Social History   Tobacco Use  . Smoking status: Never Smoker  . Smokeless tobacco: Never Used  Substance Use Topics  . Alcohol use: Yes    Alcohol/week: 1.0 standard drinks    Types: 1 Glasses of wine per week    Comment: occasional  . Drug use: No     [facility] Visit from 11/12/2017 in [facility]  AUDIT-C Score  1      Interim medical history since last visit reviewed. Allergies and medications reviewed  Review of Systems Per HPI unless specifically indicated above     Objective:    BP 122/86   Pulse 93   Temp 98.1 F (36.7 C)   Ht 5\' 6"  (1.676 m)   Wt 177 lb 8 oz (80.5 kg)   LMP  (LMP Unknown)   SpO2 98%   BMI 28.65 kg/m   Wt Readings from Last 3  Encounters:  11/12/17 177 lb 8 oz (80.5 kg)  06/05/17 170 lb 1.6 oz (77.2 kg)  05/22/17 172 lb (78 kg)    Physical Exam  Constitutional: She appears well-developed and well-nourished. No distress.  HENT:  Head: Normocephalic and atraumatic.  Eyes: EOM are normal. No scleral icterus.  Neck: No JVD present. No thyromegaly present.  Cardiovascular: Normal rate, regular rhythm and normal heart sounds.  No murmur heard. Pulmonary/Chest: Effort normal and breath sounds normal. No respiratory distress. She has no wheezes. She has no rales.  Abdominal: Soft. Bowel sounds are normal. She exhibits no distension.  Musculoskeletal: She exhibits no edema (no pitting pretibial edema).  Neurological: She is alert.  Reflex Scores:      Patellar reflexes are 2+ on the right side and 2+ on the left side. Skin: Skin is warm and dry. She is not diaphoretic. No pallor.  Psychiatric: She has a normal mood and affect. Her behavior is normal. Judgment and thought content normal. Her mood appears not anxious. She does not exhibit a depressed mood.    Results for orders placed or performed in visit on 06/05/17  Cytology - PAP  Result Value Ref Range   CYTOLOGY - PAP PAP RESULT       Assessment & Plan:   Problem List Items Addressed This Visit      Other   Fatigue   Relevant Orders   ECHOCARDIOGRAM COMPLETE   TSH   CBC with Differential/Platelet   COMPLETE METABOLIC PANEL WITH GFR   VITAMIN D 25 Hydroxy (Vit-D Deficiency, Fractures)   Vitamin B12    Other Visit Diagnoses    Dyspnea on exertion    -  Primary   Relevant Orders   ECHOCARDIOGRAM COMPLETE   B Nat Peptide   Bilateral leg edema       Relevant Orders   ECHOCARDIOGRAM COMPLETE   B Nat Peptide   Arthralgia, unspecified joint       Relevant Orders   ANA,IFA RA Diag Pnl w/rflx Tit/Patn       Follow up plan: Return in about 1 week (around 11/19/2017) for follow-up visit with Dr. Sanda Klein.  An after-visit summary was printed and given  to the patient at [place].  Please see the patient instructions which may contain other information and recommendations beyond what is mentioned above in the assessment and plan.  No orders of the defined types were placed in this encounter.   Orders Placed  This Encounter  Procedures  . B Nat Peptide  . TSH  . CBC with Differential/Platelet  . COMPLETE METABOLIC PANEL WITH GFR  . ANA,IFA RA Diag Pnl w/rflx Tit/Patn  . VITAMIN D 25 Hydroxy (Vit-D Deficiency, Fractures)  . Vitamin B12  . ECHOCARDIOGRAM COMPLETE

## 2017-11-12 NOTE — Patient Instructions (Signed)
Let me know if your shortness of breath worsens and we'll get a chest xray Please call [phone] to schedule your echocardiogram Please wait 2-3 days after the order has been placed to call and get your echocardiogram scheduled Let's get labs today If you have not heard anything from my staff in a week about any orders/referrals/studies from today, please contact us here to follow-up [phone]

## 2017-11-14 LAB — COMPLETE METABOLIC PANEL WITH GFR
AG Ratio: 1.3 (calc) (ref 1.0–2.5)
ALKALINE PHOSPHATASE (APISO): 94 U/L (ref 33–115)
ALT: 20 U/L (ref 6–29)
AST: 26 U/L (ref 10–35)
Albumin: 4.3 g/dL (ref 3.6–5.1)
BUN: 12 mg/dL (ref 7–25)
CALCIUM: 9.1 mg/dL (ref 8.6–10.2)
CHLORIDE: 101 mmol/L (ref 98–110)
CO2: 26 mmol/L (ref 20–32)
Creat: 0.82 mg/dL (ref 0.50–1.10)
GFR, EST NON AFRICAN AMERICAN: 85 mL/min/{1.73_m2} (ref >=60)
GFR, Est African American: 99 mL/min/{1.73_m2} (ref >=60)
Globulin: 3.2 g/dL (calc) (ref 1.9–3.7)
Glucose, Bld: 87 mg/dL (ref 65–99)
POTASSIUM: 4.4 mmol/L (ref 3.5–5.3)
Sodium: 136 mmol/L (ref 135–146)
Total Bilirubin: 0.4 mg/dL (ref 0.2–1.2)
Total Protein: 7.5 g/dL (ref 6.1–8.1)

## 2017-11-14 LAB — CBC WITH DIFFERENTIAL/PLATELET
BASOS ABS: 41 {cells}/uL (ref 0–200)
Basophils Relative: 0.5 %
EOS PCT: 0.6 %
Eosinophils Absolute: 49 cells/uL (ref 15–500)
HCT: 45.2 % — ABNORMAL HIGH (ref 35.0–45.0)
Hemoglobin: 15.4 g/dL (ref 11.7–15.5)
Lymphs Abs: 1952 cells/uL (ref 850–3900)
MCH: 30.8 pg (ref 27.0–33.0)
MCHC: 34.1 g/dL (ref 32.0–36.0)
MCV: 90.4 fL (ref 80.0–100.0)
MPV: 10.4 fL (ref 7.5–12.5)
Monocytes Relative: 4.9 %
NEUTROS PCT: 70.2 %
Neutro Abs: 5756 cells/uL (ref 1500–7800)
PLATELETS: 356 10*3/uL (ref 140–400)
RBC: 5 10*6/uL (ref 3.80–5.10)
RDW: 12 % (ref 11.0–15.0)
TOTAL LYMPHOCYTE: 23.8 %
WBC mixed population: 402 cells/uL (ref 200–950)
WBC: 8.2 10*3/uL (ref 3.8–10.8)

## 2017-11-14 LAB — ANA,IFA RA DIAG PNL W/RFLX TIT/PATN
ANA: POSITIVE — AB
Cyclic Citrullin Peptide Ab: <16 UNITS

## 2017-11-14 LAB — ANTI-NUCLEAR AB-TITER (ANA TITER)

## 2017-11-14 LAB — BRAIN NATRIURETIC PEPTIDE: Brain Natriuretic Peptide: 9 pg/mL (ref <100)

## 2017-11-14 LAB — VITAMIN D 25 HYDROXY (VIT D DEFICIENCY, FRACTURES): Vit D, 25-Hydroxy: 31 ng/mL (ref 30–100)

## 2017-11-14 LAB — TSH: TSH: 0.89 m[IU]/L

## 2017-11-14 LAB — VITAMIN B12: Vitamin B-12: 530 pg/mL (ref 200–1100)

## 2017-11-15 ENCOUNTER — Telehealth: Payer: Self-pay

## 2017-11-15 NOTE — Telephone Encounter (Signed)
Copied from [place] [id number]. Topic: Quick Communication - Lab Results >> Nov 15, 2017  1:11 PM Scherrie Gerlach wrote: Pt following up on her lab results.  Pt wants to make sure she needs to keep appt for Monday if you do not have the answers she needs

## 2017-11-16 ENCOUNTER — Ambulatory Visit
Admission: RE | Admit: 2017-11-16 | Discharge: 2017-11-16 | Disposition: A | Discharge status: Home / Self Care | Payer: BC Managed Care – PPO | Source: Ambulatory Visit | Attending: Family Medicine | Admitting: Family Medicine

## 2017-11-16 DIAGNOSIS — R5383 Other fatigue: Secondary | ICD-10-CM | POA: Diagnosis not present

## 2017-11-16 DIAGNOSIS — I517 Cardiomegaly: Secondary | ICD-10-CM | POA: Insufficient documentation

## 2017-11-16 DIAGNOSIS — R51 Headache: Secondary | ICD-10-CM | POA: Diagnosis not present

## 2017-11-16 DIAGNOSIS — R079 Chest pain, unspecified: Secondary | ICD-10-CM | POA: Insufficient documentation

## 2017-11-16 DIAGNOSIS — K219 Gastro-esophageal reflux disease without esophagitis: Secondary | ICD-10-CM | POA: Insufficient documentation

## 2017-11-16 DIAGNOSIS — R600 Localized edema: Secondary | ICD-10-CM | POA: Diagnosis not present

## 2017-11-16 DIAGNOSIS — R0609 Other forms of dyspnea: Secondary | ICD-10-CM | POA: Insufficient documentation

## 2017-11-16 NOTE — Telephone Encounter (Signed)
Please let her know that I was out of the office yesterday I would like to go over her labs and echo with her soon See if echo report will be available for our Monday appt; if it will be a few days, then let me see her AFTER her echo is back

## 2017-11-16 NOTE — Telephone Encounter (Signed)
Pt calling to see if lab results are in and asking if she should keep appt on Monday 10/7. Please advise.  Per Cassandra ok to make high priority

## 2017-11-16 NOTE — Progress Notes (Signed)
*  PRELIMINARY RESULTS* Echocardiogram 2D Echocardiogram has been performed.  Dana Fitzpatrick 11/16/2017, 10:46 AM

## 2017-11-19 ENCOUNTER — Encounter: Payer: Self-pay | Admitting: Family Medicine

## 2017-11-19 ENCOUNTER — Ambulatory Visit
Admission: RE | Admit: 2017-11-19 | Discharge: 2017-11-19 | Disposition: A | Discharge status: Home / Self Care | Payer: BC Managed Care – PPO | Source: Ambulatory Visit | Attending: Family Medicine | Admitting: Family Medicine

## 2017-11-19 ENCOUNTER — Ambulatory Visit: Payer: BC Managed Care – PPO | Admitting: Family Medicine

## 2017-11-19 VITALS — BP 120/78 | HR 87 | Temp 98.4°F | Ht 66.0 in | Wt 176.1 lb

## 2017-11-19 DIAGNOSIS — R059 Cough, unspecified: Secondary | ICD-10-CM

## 2017-11-19 DIAGNOSIS — I071 Rheumatic tricuspid insufficiency: Secondary | ICD-10-CM | POA: Insufficient documentation

## 2017-11-19 DIAGNOSIS — J45990 Exercise induced bronchospasm: Secondary | ICD-10-CM | POA: Diagnosis not present

## 2017-11-19 DIAGNOSIS — R05 Cough: Secondary | ICD-10-CM | POA: Insufficient documentation

## 2017-11-19 DIAGNOSIS — R0602 Shortness of breath: Secondary | ICD-10-CM

## 2017-11-19 DIAGNOSIS — R768 Other specified abnormal immunological findings in serum: Secondary | ICD-10-CM | POA: Insufficient documentation

## 2017-11-19 NOTE — Patient Instructions (Addendum)
We'll have you see the rheumatologist We'll get the chest xray today across the street We'll get lung testing If you have not heard anything from my staff in a week about any orders/referrals/studies from today, please contact us here to follow-up [phone] Let me know in 3 months if we need to recheck your thyroid tests

## 2017-11-19 NOTE — Assessment & Plan Note (Signed)
Trivial on echo 2019

## 2017-11-19 NOTE — Assessment & Plan Note (Signed)
Offered referral to pulm versus PFTs

## 2017-11-19 NOTE — Assessment & Plan Note (Signed)
Patient would like to see a rheumatologist; explained that the ANA may be false positive

## 2017-11-19 NOTE — Progress Notes (Signed)
BP 120/78   Pulse 87   Temp 98.4 F (36.9 C)   Ht 5\' 6"  (1.676 m)   Wt 176 lb 1.6 oz (79.9 kg)   LMP  (LMP Unknown)   SpO2 99%   BMI 28.42 kg/m    Subjective:    Patient ID: Dana Fitzpatrick, female    DOB: [birth year], 47 y.o.   MRN: [id number]  HPI: TORIANN Fitzpatrick is a 47 y.o. female  Chief Complaint  Patient presents with  . Follow-up    HPI She was seen last a week ago; labs done, echo, here to review results The last few weeks, she has felt like achy and sick kind of tiredness So tired and usually likes to organize; just so tired, could not do housework; gained 20 pounds in a month; she was diagnosed with depression and said she was going somewhere else; she did not think she was depressed; sleep tests done and not sleep apnea; she ended up with the venlafaxine; had some anxiety and that helped with the anxiety; she is not depressed; just wants to sleep all the time; weight going on; so tired, not tackling the things she wants to tackle Mother has arthritis, not sure if rheumatoid; her sister has a skin condition chronic pemphigus (Hailey-Hailey disease); not sure about thyroid Normal vit D, vit B12, TSH, hemoglobin, glucose Chronic mild cough; diagnosed with exercise-induced asthma; going out in the cold makes it worse; going on for years, more noticeable; she has used SABA; did use daily controlled prescribed by ENT; also had acid reflux, helped that; had surgery and came off the daily inhaler  Depression screen [facility] 2/9 11/19/2017 11/19/2017 11/12/2017 05/22/2017 05/23/2016  Decreased Interest 0 0 0 0 0  Down, Depressed, Hopeless 0 0 0 0 0  PHQ - 2 Score 0 0 0 0 0  Altered sleeping 0 - - - -  Tired, decreased energy 0 - - - -  Change in appetite 0 - - - -  Feeling bad or failure about yourself  0 - - - -  Trouble concentrating 0 - - - -  Moving slowly or fidgety/restless 0 - - - -  Suicidal thoughts 0 - - - -  PHQ-9 Score 0 - - - -   Fall Risk  11/19/2017 11/12/2017  05/22/2017 05/23/2016 11/02/2015  Falls in the past year? No No No No No    Relevant past medical, surgical, family and social history reviewed Past Medical History:  Diagnosis Date  . Acute superficial gastritis without hemorrhage   . Anxiety   . Cancer ([place])    skin ca  . GERD (gastroesophageal reflux disease)    better since surgery  . Headache   . Mild exercise-induced asthma    Triggered by cold air or scents,   . Muscular abdominal pain in left flank   . Musculoskeletal chest pain   . Other fatigue   . Unrefreshed by sleep    Past Surgical History:  Procedure Laterality Date  . COLONOSCOPY  2003   normal  . DILITATION & CURRETTAGE/HYSTROSCOPY WITH NOVASURE ABLATION N/A 01/10/2016   Procedure: DILATATION & CURETTAGE/HYSTEROSCOPY WITH NOVASURE ABLATION;  Surgeon: Brayton Mars, MD;  Location: [facility] ORS;  Service: Gynecology;  Laterality: N/A;  . LAPAROSCOPIC VAGINAL HYSTERECTOMY WITH SALPINGECTOMY Bilateral 04/10/2016   Procedure: LAPAROSCOPIC ASSISTED VAGINAL HYSTERECTOMY WITH BILATERAL SALPINGECTOMY;  Surgeon: Brayton Mars, MD;  Location: [facility] ORS;  Service: Gynecology;  Laterality: Bilateral;  . LAPAROSCOPY  2003   acid reflux surgery  . TONSILLECTOMY    . TONSILLECTOMY     Family History  Problem Relation Age of Onset  . Heart disease Mother   . Dementia Mother   . Heart attack Father   . Breast cancer Paternal Aunt 67  . Ovarian cancer Neg Hx   . Colon cancer Neg Hx   . Diabetes Neg Hx    Social History   Tobacco Use  . Smoking status: Never Smoker  . Smokeless tobacco: Never Used  Substance Use Topics  . Alcohol use: Yes    Alcohol/week: 1.0 standard drinks    Types: 1 Glasses of wine per week    Comment: occasional  . Drug use: No     [facility] Visit from 11/19/2017 in [facility]  AUDIT-C Score  1      Interim medical history since last visit reviewed. Allergies and medications reviewed  Review of Systems Per HPI  unless specifically indicated above     Objective:    BP 120/78   Pulse 87   Temp 98.4 F (36.9 C)   Ht 5\' 6"  (1.676 m)   Wt 176 lb 1.6 oz (79.9 kg)   LMP  (LMP Unknown)   SpO2 99%   BMI 28.42 kg/m   Wt Readings from Last 3 Encounters:  11/19/17 176 lb 1.6 oz (79.9 kg)  11/12/17 177 lb 8 oz (80.5 kg)  06/05/17 170 lb 1.6 oz (77.2 kg)    Physical Exam  Constitutional: She appears well-developed and well-nourished. No distress.  HENT:  Head: Normocephalic and atraumatic.  Eyes: EOM are normal. No scleral icterus.  Neck: No thyromegaly present.  Cardiovascular: Normal rate, regular rhythm and normal heart sounds.  No murmur heard. Pulmonary/Chest: Effort normal and breath sounds normal. No stridor. No respiratory distress. She has no wheezes. She has no rales.  Abdominal: Soft. Bowel sounds are normal. She exhibits no distension.  Musculoskeletal: She exhibits no edema.  Neurological: She is alert.  Skin: Skin is warm and dry. She is not diaphoretic. No pallor.  Psychiatric: She has a normal mood and affect. Her behavior is normal. Judgment and thought content normal.    Results for orders placed or performed in visit on 11/12/17  B Nat Peptide  Result Value Ref Range   Brain Natriuretic Peptide 9 <100 pg/mL  TSH  Result Value Ref Range   TSH 0.89 mIU/L  CBC with Differential/Platelet  Result Value Ref Range   WBC 8.2 3.8 - 10.8 Thousand/uL   RBC 5.00 3.80 - 5.10 Million/uL   Hemoglobin 15.4 11.7 - 15.5 g/dL   HCT 45.2 (H) 35.0 - 45.0 %   MCV 90.4 80.0 - 100.0 fL   MCH 30.8 27.0 - 33.0 pg   MCHC 34.1 32.0 - 36.0 g/dL   RDW 12.0 11.0 - 15.0 %   Platelets 356 140 - 400 Thousand/uL   MPV 10.4 7.5 - 12.5 fL   Neutro Abs 5,756 1,500 - 7,800 cells/uL   Lymphs Abs 1,952 850 - 3,900 cells/uL   WBC mixed population 402 200 - 950 cells/uL   Eosinophils Absolute 49 15 - 500 cells/uL   Basophils Absolute 41 0 - 200 cells/uL   Neutrophils Relative % 70.2 %   Total  Lymphocyte 23.8 %   Monocytes Relative 4.9 %   Eosinophils Relative 0.6 %   Basophils Relative 0.5 %  COMPLETE METABOLIC PANEL WITH GFR  Result Value Ref Range   Glucose,  Bld 87 65 - 99 mg/dL   BUN 12 7 - 25 mg/dL   Creat 0.82 0.50 - 1.10 mg/dL   GFR, Est Non African American 85 > OR = 60 mL/min/1.41m2   GFR, Est African American 99 > OR = 60 mL/min/1.16m2   BUN/Creatinine Ratio NOT APPLICABLE 6 - 22 (calc)   Sodium 136 135 - 146 mmol/L   Potassium 4.4 3.5 - 5.3 mmol/L   Chloride 101 98 - 110 mmol/L   CO2 26 20 - 32 mmol/L   Calcium 9.1 8.6 - 10.2 mg/dL   Total Protein 7.5 6.1 - 8.1 g/dL   Albumin 4.3 3.6 - 5.1 g/dL   Globulin 3.2 1.9 - 3.7 g/dL (calc)   AG Ratio 1.3 1.0 - 2.5 (calc)   Total Bilirubin 0.4 0.2 - 1.2 mg/dL   Alkaline phosphatase (APISO) 94 33 - 115 U/L   AST 26 10 - 35 U/L   ALT 20 6 - 29 U/L  ANA,IFA RA Diag Pnl w/rflx Tit/Patn  Result Value Ref Range   Anti Nuclear Antibody(ANA) POSITIVE (A) NEGATIVE   Rhuematoid fact SerPl-aCnc <24 <09 IU/mL   Cyclic Citrullin Peptide Ab <16 UNITS   INTERPRETATION    VITAMIN D 25 Hydroxy (Vit-D Deficiency, Fractures)  Result Value Ref Range   Vit D, 25-Hydroxy 31 30 - 100 ng/mL  Vitamin B12  Result Value Ref Range   Vitamin B-12 530 200 - 1,100 pg/mL  Anti-nuclear ab-titer (ANA titer)  Result Value Ref Range   ANA Titer 1 1:40 (H) titer   ANA Pattern 1 Nuclear, Speckled (A)       Assessment & Plan:   Problem List Items Addressed This Visit      Cardiovascular and Mediastinum   Tricuspid regurgitation    Trivial on echo 2019        Respiratory   Asthma, exercise induced    Offered referral to pulm versus PFTs      Relevant Orders   Pulmonary Function Test [facility] Only     Other   Positive ANA (antinuclear antibody) - Primary    Patient would like to see a rheumatologist; explained that the ANA may be false positive      Relevant Orders   Ambulatory referral to Rheumatology    Other Visit Diagnoses     Cough       Relevant Orders   DG Chest 2 View   Pulmonary Function Test [facility] Only   Shortness of breath       Relevant Orders   Pulmonary Function Test [facility] Only       Follow up plan: Return in about 4 weeks (around 12/17/2017) for follow-up visit with Dr. Sanda Klein.  An after-visit summary was printed and given to the patient at [place].  Please see the patient instructions which may contain other information and recommendations beyond what is mentioned above in the assessment and plan.  No orders of the defined types were placed in this encounter.   Orders Placed This Encounter  Procedures  . DG Chest 2 View  . Ambulatory referral to Rheumatology  . Pulmonary Function Test [facility] Only

## 2017-11-27 DIAGNOSIS — R768 Other specified abnormal immunological findings in serum: Secondary | ICD-10-CM | POA: Insufficient documentation

## 2017-11-29 ENCOUNTER — Ambulatory Visit: Payer: BC Managed Care – PPO | Attending: Family Medicine

## 2017-11-29 DIAGNOSIS — R05 Cough: Secondary | ICD-10-CM | POA: Insufficient documentation

## 2017-11-29 DIAGNOSIS — R059 Cough, unspecified: Secondary | ICD-10-CM

## 2017-11-29 DIAGNOSIS — R0602 Shortness of breath: Secondary | ICD-10-CM

## 2017-11-29 DIAGNOSIS — J45990 Exercise induced bronchospasm: Secondary | ICD-10-CM | POA: Diagnosis not present

## 2017-11-29 MED ORDER — ALBUTEROL SULFATE (2.5 MG/3ML) 0.083% IN NEBU
2.5000 mg | INHALATION_SOLUTION | Freq: Once | RESPIRATORY_TRACT | Status: AC
  Administered 2017-11-29: 2.5 mg via RESPIRATORY_TRACT
  Filled 2017-11-29: qty 3

## 2017-12-03 ENCOUNTER — Encounter: Payer: Self-pay | Admitting: Family Medicine

## 2017-12-05 NOTE — Telephone Encounter (Signed)
Patient is calling in wanting results from testing.

## 2017-12-06 NOTE — Telephone Encounter (Signed)
Please find out about the interpretation of the PFTs

## 2017-12-07 NOTE — Telephone Encounter (Signed)
Dana Fitzpatrick, Please ask for the PFT interpretation. I need a pulmonologist to interpret the PFTs please.

## 2017-12-12 ENCOUNTER — Telehealth: Payer: Self-pay | Admitting: Internal Medicine

## 2017-12-12 NOTE — Telephone Encounter (Signed)
Per Dr. Mortimer Fries PFT have been resulted and are normal. If you have any questions feel free to call our office. [phone]

## 2017-12-12 NOTE — Telephone Encounter (Signed)
[facility] calling asking who is to have read patient's PFT that was done 11/29/17  They are unable to see interpretations   Please call

## 2017-12-16 NOTE — Telephone Encounter (Signed)
Please let patient know that the pulmonary function tests were all normal Thank you

## 2017-12-17 ENCOUNTER — Encounter: Payer: Self-pay | Admitting: Family Medicine

## 2017-12-17 ENCOUNTER — Ambulatory Visit: Payer: BC Managed Care – PPO | Admitting: Family Medicine

## 2017-12-17 ENCOUNTER — Other Ambulatory Visit
Admission: RE | Admit: 2017-12-17 | Discharge: 2017-12-17 | Disposition: A | Discharge status: Home / Self Care | Payer: BC Managed Care – PPO | Source: Ambulatory Visit | Attending: Family Medicine | Admitting: Family Medicine

## 2017-12-17 VITALS — BP 118/70 | HR 88 | Temp 98.3°F | Ht 66.0 in | Wt 180.1 lb

## 2017-12-17 DIAGNOSIS — R0602 Shortness of breath: Secondary | ICD-10-CM

## 2017-12-17 DIAGNOSIS — R768 Other specified abnormal immunological findings in serum: Secondary | ICD-10-CM | POA: Insufficient documentation

## 2017-12-17 LAB — FIBRIN DERIVATIVES D-DIMER (ARMC ONLY): FIBRIN DERIVATIVES D-DIMER (ARMC): 411.49 ng{FEU}/mL (ref 0.00–499.00)

## 2017-12-17 NOTE — Progress Notes (Signed)
BP 118/70   Pulse 88   Temp 98.3 F (36.8 C)   Ht 5\' 6"  (1.676 m)   Wt 180 lb 1.6 oz (81.7 kg)   LMP  (LMP Unknown)   SpO2 95%   BMI 29.07 kg/m    Subjective:    Patient ID: Dana Fitzpatrick, female    DOB: 11/13/1970, 47 y.o.   MRN: [id number]  HPI: Dana Fitzpatrick is a 47 y.o. female  Chief Complaint  Patient presents with  . Follow-up    HPI Patient is here for f/u She was seen on September 30th with dyspnea on exertion and cough and increased fatigue She had a positive ANA, low level 1:40 with nuclear/speckled pattern; she saw Dr. Annalee Genta (rheumatologist) on October 15th Normal PFTs per Dr. 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; C3 182, C4 45 Normal anticardiolipin  Itchy eyes; not sure if dry eyes, using drops in her eyes occasionally; wears contacts too; mouth gets dry at night She does have some aching in her hands and knees and toes; not as bad today, does wake her up at times  Still having some SHOB; still coughing; some pleuritic discomfort along the right lower chest She had been to a sleep doctor and he did not think she had sleep apnea; not even tested; not enough symptoms Hct was 0.2 above normal No hx of blood clot personally or in family  Depression screen [facility] 2/9 12/17/2017 11/19/2017 11/19/2017 11/12/2017 05/22/2017  Decreased Interest 0 0 0 0 0  Down, Depressed, Hopeless 0 0 0 0 0  PHQ - 2 Score 0 0 0 0 0  Altered sleeping 0 0 - - -  Tired, decreased energy 0 0 - - -  Change in appetite 0 0 - - -  Feeling bad or failure about yourself  0 0 - - -  Trouble concentrating 0 0 - - -  Moving slowly or fidgety/restless 0 0 - - -  Suicidal thoughts 0 0 - - -  PHQ-9 Score 0 0 - - -  Difficult doing work/chores Not difficult at all - - - -   Fall Risk  12/17/2017 11/19/2017 11/12/2017  05/22/2017 05/23/2016  Falls in the past year? 0 No No No No    Relevant past medical, surgical, family and social history reviewed Past Medical History:  Diagnosis Date  . Acute superficial gastritis without hemorrhage   . Anxiety   . Cancer ([place])    skin ca  . GERD (gastroesophageal reflux disease)    better since surgery  . Headache   . Mild exercise-induced asthma    Triggered by cold air or scents,   . Muscular abdominal pain in left flank   . Musculoskeletal chest pain   . Other fatigue   . Unrefreshed by sleep    Past Surgical History:  Procedure Laterality Date  . COLONOSCOPY  2003   normal  . DILITATION & CURRETTAGE/HYSTROSCOPY WITH NOVASURE ABLATION N/A 01/10/2016   Procedure: DILATATION & CURETTAGE/HYSTEROSCOPY WITH NOVASURE ABLATION;  Surgeon: Brayton Mars, MD;  Location: [facility] ORS;  Service: Gynecology;  Laterality: N/A;  . LAPAROSCOPIC VAGINAL HYSTERECTOMY WITH SALPINGECTOMY Bilateral 04/10/2016   Procedure: LAPAROSCOPIC ASSISTED VAGINAL HYSTERECTOMY WITH BILATERAL SALPINGECTOMY;  Surgeon: Brayton Mars, MD;  Location: [facility] ORS;  Service: Gynecology;  Laterality: Bilateral;  .  LAPAROSCOPY  2003   acid reflux surgery  . TONSILLECTOMY    . TONSILLECTOMY     Family History  Problem Relation Age of Onset  . Heart disease Mother   . Dementia Mother   . Heart attack Father   . Breast cancer Paternal Aunt 97  . Ovarian cancer Neg Hx   . Colon cancer Neg Hx   . Diabetes Neg Hx    Social History   Tobacco Use  . Smoking status: Never Smoker  . Smokeless tobacco: Never Used  Substance Use Topics  . Alcohol use: Yes    Alcohol/week: 1.0 standard drinks    Types: 1 Glasses of wine per week    Comment: occasional  . Drug use: No     Office Visit from 12/17/2017 in [facility]  AUDIT-C Score  1      Interim medical history since last visit reviewed. Allergies and medications reviewed  Review of Systems Per HPI unless  specifically indicated above     Objective:    BP 118/70   Pulse 88   Temp 98.3 F (36.8 C)   Ht 5\' 6"  (1.676 m)   Wt 180 lb 1.6 oz (81.7 kg)   LMP  (LMP Unknown)   SpO2 95%   BMI 29.07 kg/m   Wt Readings from Last 3 Encounters:  12/17/17 180 lb 1.6 oz (81.7 kg)  11/19/17 176 lb 1.6 oz (79.9 kg)  11/12/17 177 lb 8 oz (80.5 kg)    Today's Vitals   12/17/17 1523 12/17/17 1620  BP: 118/70   Pulse: (!) 106 88  Temp: 98.3 F (36.8 C)   SpO2: 95%   Weight: 180 lb 1.6 oz (81.7 kg)   Height: 5\' 6"  (1.676 m)     Physical Exam  Constitutional: She appears well-developed and well-nourished. No distress.  HENT:  Head: Normocephalic and atraumatic.  Eyes: EOM are normal. No scleral icterus.  Neck: No thyromegaly present.  Cardiovascular: Normal rate, regular rhythm and normal heart sounds.  No murmur heard. Pulmonary/Chest: Effort normal and breath sounds normal. No respiratory distress. She has no wheezes.  Abdominal: Soft. Bowel sounds are normal. She exhibits no distension.  Musculoskeletal: She exhibits no edema.  Neurological: She is alert.  Skin: Skin is warm and dry. She is not diaphoretic. No pallor.  Psychiatric: She has a normal mood and affect. Her behavior is normal. Judgment and thought content normal.       Assessment & Plan:   Problem List Items Addressed This Visit      Other   Positive ANA (antinuclear antibody) - Primary    May be false positive; reviewed notes from rheumatologist; asking rheum about the complement level question; patient wishes to recheck today      Relevant Orders   ANA, IFA (with reflex) (Completed)    Other Visit Diagnoses    Shortness of breath       we talked about steps going forward, labs, consider chest CT, refer to pulm; will check labs today and then decide       Follow up plan: Return if symptoms worsen or fail to improve.  An after-visit summary was printed and given to the patient at [place].  Please see the  patient instructions which may contain other information and recommendations beyond what is mentioned above in the assessment and plan.  No orders of the defined types were placed in this encounter.   Orders Placed This Encounter  Procedures  .  ANA, IFA (with reflex)

## 2017-12-17 NOTE — Telephone Encounter (Signed)
Pt.notified

## 2017-12-17 NOTE — Patient Instructions (Signed)
Please go to the hospital lab for blood work now I will let you know the results at [phone] [facility] do further follow-up as needed

## 2017-12-18 ENCOUNTER — Encounter: Payer: Self-pay | Admitting: Family Medicine

## 2017-12-18 ENCOUNTER — Telehealth: Payer: Self-pay

## 2017-12-18 LAB — ANTINUCLEAR ANTIBODIES, IFA: ANA Ab, IFA: NEGATIVE

## 2017-12-18 NOTE — Telephone Encounter (Signed)
-----   Message from Arnetha Courser, MD sent at 12/17/2017  4:04 PM EST ----- Regarding: contact Dr. Vernie Shanks office Please ask rheum if there was any significance to the elevated C3 and C4 levels. Thank you

## 2017-12-18 NOTE — Telephone Encounter (Signed)
Nurse of [facility]ock called back, she states Dr. Meda Coffee says a low complement  level has has more clinical significance, over the patients labs and hx which show a low probability of sustimic lupus

## 2017-12-18 NOTE — Telephone Encounter (Signed)
Left message with staff at [facility] clinic to call back with info.

## 2017-12-18 NOTE — Telephone Encounter (Signed)
Thank you so much Please let the patient know the response, as patient was aware we were sending that message to the rheumatologist

## 2017-12-19 ENCOUNTER — Encounter: Payer: Self-pay | Admitting: Family Medicine

## 2017-12-19 DIAGNOSIS — R05 Cough: Secondary | ICD-10-CM

## 2017-12-19 DIAGNOSIS — R0602 Shortness of breath: Secondary | ICD-10-CM

## 2017-12-19 DIAGNOSIS — R059 Cough, unspecified: Secondary | ICD-10-CM

## 2017-12-19 NOTE — Telephone Encounter (Signed)
Patient notified, but wants to know what the next step is?

## 2017-12-20 NOTE — Telephone Encounter (Signed)
We've already communicated through [place] and she will see pulmonologist

## 2017-12-24 NOTE — Assessment & Plan Note (Signed)
May be false positive; reviewed notes from rheumatologist; asking rheum about the complement level question; patient wishes to recheck today

## 2018-04-10 ENCOUNTER — Telehealth: Payer: Self-pay | Admitting: Family Medicine

## 2018-04-10 DIAGNOSIS — G4733 Obstructive sleep apnea (adult) (pediatric): Secondary | ICD-10-CM

## 2018-04-10 NOTE — Telephone Encounter (Signed)
I tried to contact this patient but her husband stated that she was not in and will not be in until around 5:15pm.  CRM will be placed.

## 2018-04-10 NOTE — Telephone Encounter (Signed)
[facility] doctor ordered sleep study It showed severe obstructive sleep apnea Please let the patient know that we're very glad they are working with her to find out what's going on We want to make sure she follows up with them for next steps for any additional testing and treatment Thank you

## 2018-04-11 ENCOUNTER — Encounter: Payer: Self-pay | Admitting: Family Medicine

## 2018-04-23 ENCOUNTER — Ambulatory Visit: Payer: Self-pay | Admitting: *Deleted

## 2018-04-23 ENCOUNTER — Ambulatory Visit: Payer: BC Managed Care – PPO | Admitting: Family Medicine

## 2018-04-23 ENCOUNTER — Encounter: Payer: Self-pay | Admitting: Family Medicine

## 2018-04-23 VITALS — BP 142/96 | HR 111 | Temp 98.0°F | Resp 16 | Ht 66.0 in | Wt 182.6 lb

## 2018-04-23 DIAGNOSIS — Z1231 Encounter for screening mammogram for malignant neoplasm of breast: Secondary | ICD-10-CM

## 2018-04-23 DIAGNOSIS — R0602 Shortness of breath: Secondary | ICD-10-CM

## 2018-04-23 DIAGNOSIS — I10 Essential (primary) hypertension: Secondary | ICD-10-CM

## 2018-04-23 DIAGNOSIS — R9431 Abnormal electrocardiogram [ECG] [EKG]: Secondary | ICD-10-CM | POA: Diagnosis not present

## 2018-04-23 NOTE — Patient Instructions (Addendum)
I am concerned that you may have have a pulmonary embolism or pericardial effusion or congestive heart failure or other problem that could be serious or fatal I recommend that you go by ambulance to the nearest emergency department Since you decline that, my next recommendation is to go to the nearly ED Since you decline that, please do go right away to the ED of your choice  Suggested testing will include repeat EKG, BNP, CBC, CXR, chest CT PE protocol We may get renal artery ultrasound next I'll have you see a cardiologist at [facility] (referral entered today)  Try to follow the DASH guidelines (DASH stands for Dietary Approaches to Stop Hypertension). Try to limit the sodium in your diet to no more than 1,500mg  of sodium per day. Certainly try to not exceed 2,000 mg per day at the very most. Do not add salt when cooking or at the table.  Check the sodium amount on labels when shopping, and choose items lower in sodium when given a choice. Avoid or limit foods that already contain a lot of sodium. Eat a diet rich in fruits and vegetables and whole grains, and try to lose weight if overweight or obese  Please do call back for close follow-up and we'll see you here for that Call 911 for any worsening symptoms

## 2018-04-23 NOTE — Telephone Encounter (Addendum)
Pt called with her b/p being elevated at the pulmonologist yesterday.  She is being treated for sleep apnea. She does have a history of HBP. She stated for the last couple of weeks she has been having headaches and when she checks he b/p the lower number has been in the upper 90's. Her b/p readings are 173/107 and 184/115 just taking now. She denies having headache, blurred vision, shortness of breath,  weakness, dizziness or chest pain. Called flow at [facility] and spoke with Dr. Delight Ovens CMA. Appointment scheduled per protocol. Advised for dizziness, someone should drive her. She denies dizziness. Routing to flow at [facility].  Reason for Disposition . Systolic BP  >= 244 OR Diastolic >= 010  Answer Assessment - Initial Assessment Questions 1. BLOOD PRESSURE: "What is the blood pressure?" "Did you take at least two measurements 5 minutes apart?"     151/108 average of 3 readings that was taken and then 173/107 and rechecked again to make sure that was correct, 184/115 2. ONSET: "When did you take your blood pressure?"     Right now 3. HOW: "How did you obtain the blood pressure?" (e.g., visiting nurse, automatic home BP monitor)     Automatic home BP monitor 4. HISTORY: "Do you have a history of high blood pressure?"     no 5. MEDICATIONS: "Are you taking any medications for blood pressure?" "Have you missed any doses recently?"     no 6. OTHER SYMPTOMS: "Do you have any symptoms?" (e.g., headache, chest pain, blurred vision, difficulty breathing, weakness)     no 7. PREGNANCY: "Is there any chance you are pregnant?" "When was your last menstrual period?"     Not pregnant, had hysterectomy  Protocols used: HIGH BLOOD PRESSURE-A-AH

## 2018-04-23 NOTE — Progress Notes (Signed)
BP (!) 142/96 (BP Location: Left Arm, Patient Position: Supine, Cuff Size: Large)   Pulse (!) 111   Temp 98 F (36.7 C)   Resp 16   Ht 5\' 6"  (1.676 m)   Wt 182 lb 9.6 oz (82.8 kg)   LMP  (LMP Unknown)   SpO2 95%   BMI 29.47 kg/m    Subjective:    Patient ID: Dana Fitzpatrick, female    DOB: 05-30-1970, 48 y.o.   MRN: [id number]  HPI: Dana Fitzpatrick is a 48 y.o. female  Chief Complaint  Patient presents with  . Hypertension    HPI Patient is here for an acute visit She called earlier today with complaints of high blood pressure She has noticed high blood pressures for 3 weeks Checking BP at home and at work She was putting it off because she was waiting on the sleep study thing with staff at [facility]  She says her husband pressured her to call She does not add salt to her food Checking frequently since March 7th with a cuff that loads to an app and also manually entering Top higher numbers have been 156-157 Bottom numbers have gotten up to 107 Last 3 numbers were when she was on the phone with a nurse: 173/107, 184/115, 172/112 Shortness of breath, maybe worse over the last 3 weeks Does get headaches No fam hx of blood clots No recent long car trips or airline flights; does sit a lot at work  136/98 on the right supine 142/96 on the left supine  She says her father had a massive heart attack and passed away at age 35 He had hypertension before then All of his siblings had heart issues Cousin on his side had a heart attack in his 63s (father's side) Mother may have hypertension; maternal uncles with heart attacks Nonsmoker  Echo reviewed from 11/16/2017: History:   PMH:  chest pain, GERD, headache.  ------------------------------------------------------------------- Study Conclusions  - Left ventricle: The cavity size was normal. There was mild   concentric hypertrophy. Systolic function was normal. The   estimated ejection fraction was in the range of 55%  to 60%. Wall   motion was normal; there were no regional wall motion   abnormalities. Left ventricular diastolic function parameters   were normal. - Pulmonary arteries: Systolic pressure was within the normal   range.  They are working on getting the CPAP set up No cardiac arrhythmias on sleep study (titration) Note, titration study: Impressions In this PAP titration study:  1. The patient's OSA was corrected by CPAP at level of 15 cm H2O which  included REM lateral sleep but not REM supine sleep. Overall the patient  tolerated the PAP titration.    Recommendations 1. CPAP at level of 15 cm H2O with EPR 1 and with heated humidifier for  nasal dryness, mask: ResMed  Medium AirTouch F20 or mask of patient's  preference.  2. Avoid sleeping in supine.  3. The patient needs to follow up for CPAP compliance 31 to 90 days after  initiation of CPAP therapy to ensure compliance of at least 4 hours per  night for more than 70% of nights. ____________________________________ Thad Ranger, MD Diplomate of Sleep Medicine, ABPN  Depression screen [facility] 2/9 04/23/2018 12/17/2017 11/19/2017 11/19/2017 11/12/2017  Decreased Interest 0 0 0 0 0  Down, Depressed, Hopeless 0 0 0 0 0  PHQ - 2 Score 0 0 0 0 0  Altered sleeping 0 0 0 - -  Tired, decreased energy 0 0 0 - -  Change in appetite 0 0 0 - -  Feeling bad or failure about yourself  0 0 0 - -  Trouble concentrating 0 0 0 - -  Moving slowly or fidgety/restless 0 0 0 - -  Suicidal thoughts 0 0 0 - -  PHQ-9 Score 0 0 0 - -  Difficult doing work/chores Not difficult at all Not difficult at all - - -  MD note: not depressed  Fall Risk  04/23/2018 12/17/2017 11/19/2017 11/12/2017 05/22/2017  Falls in the past year? 0 0 No No No    Relevant past medical, surgical, family and social history reviewed Past Medical History:  Diagnosis Date  . Acute superficial gastritis without hemorrhage   . Anxiety   . Cancer ([place])    skin ca  . GERD  (gastroesophageal reflux disease)    better since surgery  . Headache   . Mild exercise-induced asthma    Triggered by cold air or scents,   . Muscular abdominal pain in left flank   . Musculoskeletal chest pain   . Other fatigue   . Unrefreshed by sleep    Past Surgical History:  Procedure Laterality Date  . COLONOSCOPY  2003   normal  . DILITATION & CURRETTAGE/HYSTROSCOPY WITH NOVASURE ABLATION N/A 01/10/2016   Procedure: DILATATION & CURETTAGE/HYSTEROSCOPY WITH NOVASURE ABLATION;  Surgeon: Brayton Mars, MD;  Location: [facility] ORS;  Service: Gynecology;  Laterality: N/A;  . LAPAROSCOPIC VAGINAL HYSTERECTOMY WITH SALPINGECTOMY Bilateral 04/10/2016   Procedure: LAPAROSCOPIC ASSISTED VAGINAL HYSTERECTOMY WITH BILATERAL SALPINGECTOMY;  Surgeon: Brayton Mars, MD;  Location: [facility] ORS;  Service: Gynecology;  Laterality: Bilateral;  . LAPAROSCOPY  2003   acid reflux surgery  . TONSILLECTOMY    . TONSILLECTOMY     Family History  Problem Relation Age of Onset  . Heart disease Mother   . Dementia Mother   . Heart attack Father   . Breast cancer Paternal Aunt 56  . Ovarian cancer Neg Hx   . Colon cancer Neg Hx   . Diabetes Neg Hx    Social History   Tobacco Use  . Smoking status: Never Smoker  . Smokeless tobacco: Never Used  Substance Use Topics  . Alcohol use: Yes    Alcohol/week: 1.0 standard drinks    Types: 1 Glasses of wine per week    Comment: occasional  . Drug use: No     Office Visit from 04/23/2018 in [facility]  AUDIT-C Score  1      Interim medical history since last visit reviewed. Allergies and medications reviewed  Review of Systems  Respiratory: Positive for shortness of breath.   Cardiovascular: Negative for chest pain, palpitations and leg swelling.   Per HPI unless specifically indicated above     Objective:    BP (!) 142/96 (BP Location: Left Arm, Patient Position: Supine, Cuff Size: Large)   Pulse (!) 111    Temp 98 F (36.7 C)   Resp 16   Ht 5\' 6"  (1.676 m)   Wt 182 lb 9.6 oz (82.8 kg)   LMP  (LMP Unknown)   SpO2 95%   BMI 29.47 kg/m   Wt Readings from Last 3 Encounters:  04/23/18 182 lb 9.6 oz (82.8 kg)  12/17/17 180 lb 1.6 oz (81.7 kg)  11/19/17 176 lb 1.6 oz (79.9 kg)    Physical Exam Constitutional:      General: She is not in acute  distress.    Appearance: She is well-developed. She is not diaphoretic.  HENT:     Head: Normocephalic and atraumatic.  Eyes:     General: No scleral icterus. Neck:     Thyroid: No thyromegaly.  Cardiovascular:     Rate and Rhythm: Regular rhythm. Tachycardia present.     Heart sounds: Normal heart sounds. No murmur.  Pulmonary:     Effort: Pulmonary effort is normal. No respiratory distress.     Breath sounds: Normal breath sounds. No wheezing.  Abdominal:     General: Bowel sounds are normal. There is no distension or abdominal bruit.     Palpations: Abdomen is soft.  Skin:    General: Skin is warm and dry.     Coloration: Skin is not pale.  Neurological:     Mental Status: She is alert.  Psychiatric:        Behavior: Behavior normal.        Thought Content: Thought content normal.        Judgment: Judgment normal.        Assessment & Plan:   Problem List Items Addressed This Visit    None    Visit Diagnoses    Uncontrolled hypertension    -  Primary   recent increase in blood pressures; ddx includes hyperthyroidism, RAS, fibromuscular dysplasia, anxiety, pheo, other ddx; sending to ER now   Shortness of breath       reviewed ddx, including PE with her tachycardia; patient declines EMS/911; she declined going to nearest ER, will go to [facility] by her own choice   Relevant Orders   Ambulatory referral to Cardiology   EKG abnormalities       low voltage EKG; ddx includes pericardial effusion, pt declined EMS / 911; she will go to ER of her preference; discussed risks, she declines emergency transpor   Relevant Orders    Ambulatory referral to Cardiology   Encounter for screening mammogram for breast cancer       Relevant Orders   MM 3D SCREEN BREAST BILATERAL       Follow up plan: No follow-ups on file.  An after-visit summary was printed and given to the patient at [place].  Please see the patient instructions which may contain other information and recommendations beyond what is mentioned above in the assessment and plan.  No orders of the defined types were placed in this encounter.   Orders Placed This Encounter  Procedures  . MM 3D SCREEN BREAST BILATERAL  . Ambulatory referral to Cardiology

## 2018-04-23 NOTE — Telephone Encounter (Signed)
Appt made for today, 04/23/18 at 2:20pm.

## 2018-04-23 NOTE — Telephone Encounter (Signed)
No RN here just to verify Patient's appointment is less than 20 minutes from now

## 2018-04-24 ENCOUNTER — Telehealth: Payer: Self-pay | Admitting: Family Medicine

## 2018-04-24 DIAGNOSIS — I10 Essential (primary) hypertension: Secondary | ICD-10-CM

## 2018-04-24 MED ORDER — LOSARTAN POTASSIUM 25 MG PO TABS: 25.0000 mg | ORAL_TABLET | Freq: Every day | ORAL | 0 refills | Status: DC

## 2018-04-24 NOTE — Telephone Encounter (Signed)
Copied from [place] [id number]. Topic: General - Inquiry >> Apr 24, 2018  3:52 PM Dana Fitzpatrick wrote: Reason for CRM: pt states she was in the office yesterday for her blood pressure and Dr Sanda Klein sent her to the emergency room - she said she went to the emergency room and said she was fine and sent her home. She is wondering if Dr Sanda Klein is going to start her on any BP medications?

## 2018-04-24 NOTE — Telephone Encounter (Signed)
I called patient She will bring ddx of pulm HTN possibility to pulmonologist Order renal artery [place] Both parents had HTN [facility] Return 7-10 days for appt, call before then if needed DASH guidelines encouraged ----------------------------------------------- I ordered renal artery [place] ----------------------------------------------- I called pulmonologist to verify if CCB or HCTZ okay to start empirically Echocardiogram from October 2019 reviewed; pulmonary arteries: "systolic pressure was within the normal range" Sleep apnea syndrome mentioned in Up-to-Date as a consideration I held for 17 minutes, then called the on-call number I called consultation line after 24 minutes; they said they cannot provide specific advice for any patient and cannot use their name in the note and will not look up any patient information Pulm was paged at 32 minutes into the call Pulm responded, did not see any concern for pulmonary HTN Okay to treat HTN as I would treat any other HTN -------------------------------------------------- Rx sent Will see patient in the office in 7-10 days and get BMP at that time She will be monitoring BP in the meantime, and notify me if too high (>140/90) or trending too low

## 2018-04-25 ENCOUNTER — Encounter: Payer: Self-pay | Admitting: Family Medicine

## 2018-05-20 ENCOUNTER — Encounter: Payer: Self-pay | Admitting: Family Medicine

## 2018-05-20 ENCOUNTER — Ambulatory Visit (INDEPENDENT_AMBULATORY_CARE_PROVIDER_SITE_OTHER): Payer: BC Managed Care – PPO | Admitting: Family Medicine

## 2018-05-20 VITALS — BP 122/78 | HR 91

## 2018-05-20 DIAGNOSIS — E7800 Pure hypercholesterolemia, unspecified: Secondary | ICD-10-CM

## 2018-05-20 DIAGNOSIS — J301 Allergic rhinitis due to pollen: Secondary | ICD-10-CM

## 2018-05-20 DIAGNOSIS — Z5181 Encounter for therapeutic drug level monitoring: Secondary | ICD-10-CM

## 2018-05-20 DIAGNOSIS — I10 Essential (primary) hypertension: Secondary | ICD-10-CM | POA: Diagnosis not present

## 2018-05-20 DIAGNOSIS — G4733 Obstructive sleep apnea (adult) (pediatric): Secondary | ICD-10-CM

## 2018-05-20 DIAGNOSIS — J45990 Exercise induced bronchospasm: Secondary | ICD-10-CM | POA: Diagnosis not present

## 2018-05-20 DIAGNOSIS — F411 Generalized anxiety disorder: Secondary | ICD-10-CM | POA: Diagnosis not present

## 2018-05-20 MED ORDER — VENLAFAXINE HCL ER 75 MG PO CP24: 75.0000 mg | ORAL_CAPSULE | Freq: Every day | ORAL | 3 refills | Status: AC

## 2018-05-20 MED ORDER — ALBUTEROL SULFATE HFA 108 (90 BASE) MCG/ACT IN AERS: 1.0000 | INHALATION_SPRAY | RESPIRATORY_TRACT | 2 refills | Status: AC | PRN

## 2018-05-20 MED ORDER — LOSARTAN POTASSIUM 25 MG PO TABS: 25.0000 mg | ORAL_TABLET | Freq: Every day | ORAL | 0 refills | Status: AC

## 2018-05-20 NOTE — Progress Notes (Signed)
BP 122/78   Pulse 91   LMP  (LMP Unknown)    Subjective:    Patient ID: Dana Fitzpatrick, female    DOB: [birth year], 48 y.o.   MRN: [id number]  HPI: Dana Fitzpatrick is a 48 y.o. female  Chief Complaint  Patient presents with  . Follow-up  . Medication Refill    HPI Virtual Visit via Telephone Note   I connected with the patient by telephone on May 20, 2018 I verified that I am speaking with the correct person using two identifiers.   I discussed the limitations, risks, security, and privacy concerns of performing an evaluation and management service by telephone and the availability of in-person appointments. Staff discussed with the patient that he/she may be responsible for charges related to this service. The patient expressed understanding and agreed to proceed.  Patient location: home Provider location: home Additional participants: husband in the other room  Call started: 2:25 pm Call terminated: 2:38 pm Total length of call: 13 minutes and 29 seconds  This visit is for prescription refills She has the new blood pressure medicine That has helped quite a bit Still slightly over the normal range No high blood pressures any more Not as many headaches BP at home 122/78 She takes the medicine around 8 pm  Sleep apnea; started the CPAP machine; going pretty well It leaks though; her mouth still drops open even though she has a full face maask; they are working with her on that; she does feel a little bit more energy than before, when waking up in the morning  Allergies; stopped taking the decongestant; not much runny nose  Asthma; seeing pulmonologist; had some sort of study done and awaiting results; needs refill of inhaler  Taking venlafaxine and doing well on that; wishes to continue for her anxiety  Elevated cholesterol previously; there is heart disease in her family  Depression screen [facility] 2/9 05/20/2018 04/23/2018 12/17/2017 11/19/2017 11/19/2017   Decreased Interest 0 0 0 0 0  Down, Depressed, Hopeless 0 0 0 0 0  PHQ - 2 Score 0 0 0 0 0  Altered sleeping 0 0 0 0 -  Tired, decreased energy 0 0 0 0 -  Change in appetite 0 0 0 0 -  Feeling bad or failure about yourself  0 0 0 0 -  Trouble concentrating 0 0 0 0 -  Moving slowly or fidgety/restless 0 0 0 0 -  Suicidal thoughts 0 0 0 0 -  PHQ-9 Score 0 0 0 0 -  Difficult doing work/chores Not difficult at all Not difficult at all Not difficult at all - -   Fall Risk  05/20/2018 04/23/2018 12/17/2017 11/19/2017 11/12/2017  Falls in the past year? 0 0 0 No No  Number falls in past yr: 0 - - - -  Injury with Fall? 0 - - - -    Relevant past medical, surgical, family and social history reviewed Past Medical History:  Diagnosis Date  . Acute superficial gastritis without hemorrhage   . Anxiety   . Cancer ([place])    skin ca  . GERD (gastroesophageal reflux disease)    better since surgery  . Headache   . Mild exercise-induced asthma    Triggered by cold air or scents,   . Muscular abdominal pain in left flank   . Musculoskeletal chest pain   . OSA (obstructive sleep apnea)   . Other fatigue   . Unrefreshed by sleep  Past Surgical History:  Procedure Laterality Date  . COLONOSCOPY  2003   normal  . DILITATION & CURRETTAGE/HYSTROSCOPY WITH NOVASURE ABLATION N/A 01/10/2016   Procedure: DILATATION & CURETTAGE/HYSTEROSCOPY WITH NOVASURE ABLATION;  Surgeon: Brayton Mars, MD;  Location: [facility] ORS;  Service: Gynecology;  Laterality: N/A;  . LAPAROSCOPIC VAGINAL HYSTERECTOMY WITH SALPINGECTOMY Bilateral 04/10/2016   Procedure: LAPAROSCOPIC ASSISTED VAGINAL HYSTERECTOMY WITH BILATERAL SALPINGECTOMY;  Surgeon: Brayton Mars, MD;  Location: [facility] ORS;  Service: Gynecology;  Laterality: Bilateral;  . LAPAROSCOPY  2003   acid reflux surgery  . TONSILLECTOMY    . TONSILLECTOMY     Family History  Problem Relation Age of Onset  . Heart disease Mother   . Dementia Mother   .  Heart attack Father   . Breast cancer Paternal Aunt 60  . Ovarian cancer Neg Hx   . Colon cancer Neg Hx   . Diabetes Neg Hx    Social History   Tobacco Use  . Smoking status: Never Smoker  . Smokeless tobacco: Never Used  Substance Use Topics  . Alcohol use: Yes    Alcohol/week: 1.0 standard drinks    Types: 1 Glasses of wine per week    Comment: occasional  . Drug use: No     Office Visit from 05/20/2018 in [facility]  AUDIT-C Score  1      Interim medical history since last visit reviewed. Allergies and medications reviewed  Review of Systems Per HPI unless specifically indicated above     Objective:    BP 122/78   Pulse 91   LMP  (LMP Unknown)   Wt Readings from Last 3 Encounters:  04/23/18 182 lb 9.6 oz (82.8 kg)  12/17/17 180 lb 1.6 oz (81.7 kg)  11/19/17 176 lb 1.6 oz (79.9 kg)    Physical Exam Pulmonary:     Effort: No respiratory distress.  Neurological:     Mental Status: She is alert.     Cranial Nerves: No dysarthria.  Psychiatric:        Speech: Speech is not rapid and pressured, delayed or slurred.     Results for orders placed or performed during the hospital encounter of 12/17/17  ANA, IFA (with reflex)  Result Value Ref Range   ANA Ab, IFA Negative   Fibrin derivatives D-Dimer ([facility] only)  Result Value Ref Range   Fibrin derivatives D-dimer ([facility]) 411.49 0.00 - 499.00 ng/mL (FEU)      Assessment & Plan:   Problem List Items Addressed This Visit      Cardiovascular and Mediastinum   Essential hypertension - Primary    Doing well on ARB; will have her come in for BMP, explained the need for this to be monitored; use care when coming in for labs, hand sanitize, don't touch face, etc.; refill provided; avoid decongestant      Relevant Medications   losartan (COZAAR) 25 MG tablet   Other Relevant Orders   BASIC METABOLIC PANEL WITH GFR     Respiratory   Asthma, exercise induced   Relevant Medications    albuterol (VENTOLIN HFA) 108 (90 Base) MCG/ACT inhaler   Obstructive sleep apnea    Seeing [facility] for this, and they are adjusting her mask; she can already tell some improvement      Allergic rhinitis    Avoiding decongestant        Other   GAD (generalized anxiety disorder)    Continue SNRI  Relevant Medications   venlafaxine XR (EFFEXOR-XR) 75 MG 24 hr capsule    Other Visit Diagnoses    Encounter for medication monitoring       Relevant Orders   BASIC METABOLIC PANEL WITH GFR   Elevated cholesterol       Relevant Medications   losartan (COZAAR) 25 MG tablet   Other Relevant Orders   Lipid panel       Follow up plan: No follow-ups on file.  An after-visit summary was printed and given to the patient at [place].  Please see the patient instructions which may contain other information and recommendations beyond what is mentioned above in the assessment and plan.  Meds ordered this encounter  Medications  . losartan (COZAAR) 25 MG tablet    Sig: Take 1 tablet (25 mg total) by mouth daily.    Dispense:  90 tablet    Refill:  0  . venlafaxine XR (EFFEXOR-XR) 75 MG 24 hr capsule    Sig: Take 1 capsule (75 mg total) by mouth daily with breakfast.    Dispense:  90 capsule    Refill:  3  . albuterol (VENTOLIN HFA) 108 (90 Base) MCG/ACT inhaler    Sig: Inhale 1-2 puffs into the lungs every 4 (four) hours as needed.    Dispense:  18 g    Refill:  2    Orders Placed This Encounter  Procedures  . BASIC METABOLIC PANEL WITH GFR  . Lipid panel

## 2018-05-20 NOTE — Assessment & Plan Note (Signed)
Continue SNRI

## 2018-05-20 NOTE — Assessment & Plan Note (Signed)
Doing well on ARB; will have her come in for BMP, explained the need for this to be monitored; use care when coming in for labs, hand sanitize, don't touch face, etc.; refill provided; avoid decongestant

## 2018-05-20 NOTE — Assessment & Plan Note (Signed)
Avoiding decongestant

## 2018-05-20 NOTE — Assessment & Plan Note (Signed)
Seeing [facility] for this, and they are adjusting her mask; she can already tell some improvement

## 2018-05-21 ENCOUNTER — Other Ambulatory Visit: Payer: Self-pay

## 2018-05-21 DIAGNOSIS — I10 Essential (primary) hypertension: Secondary | ICD-10-CM

## 2018-05-21 DIAGNOSIS — E7800 Pure hypercholesterolemia, unspecified: Secondary | ICD-10-CM

## 2018-05-21 DIAGNOSIS — Z5181 Encounter for therapeutic drug level monitoring: Secondary | ICD-10-CM

## 2018-05-22 LAB — LIPID PANEL
Cholesterol: 208 mg/dL — ABNORMAL HIGH (ref <200)
HDL: 53 mg/dL (ref >=50)
LDL Cholesterol (Calc): 129 mg/dL (calc) — ABNORMAL HIGH
Non-HDL Cholesterol (Calc): 155 mg/dL (calc) — ABNORMAL HIGH (ref <130)
Total CHOL/HDL Ratio: 3.9 (calc) (ref <5.0)
Triglycerides: 145 mg/dL (ref <150)

## 2018-05-22 LAB — BASIC METABOLIC PANEL WITH GFR
BUN: 10 mg/dL (ref 7–25)
CO2: 27 mmol/L (ref 20–32)
Calcium: 9 mg/dL (ref 8.6–10.2)
Chloride: 104 mmol/L (ref 98–110)
Creat: 0.76 mg/dL (ref 0.50–1.10)
GFR, Est African American: 108 mL/min/{1.73_m2} (ref >=60)
GFR, Est Non African American: 93 mL/min/{1.73_m2} (ref >=60)
Glucose, Bld: 98 mg/dL (ref 65–99)
Potassium: 4.2 mmol/L (ref 3.5–5.3)
Sodium: 138 mmol/L (ref 135–146)

## 2018-05-23 ENCOUNTER — Ambulatory Visit: Payer: BC Managed Care – PPO | Admitting: Family Medicine

## 2018-06-12 ENCOUNTER — Encounter: Payer: Self-pay | Admitting: Family Medicine

## 2018-06-13 ENCOUNTER — Encounter: Payer: BC Managed Care – PPO | Admitting: Obstetrics and Gynecology

## 2019-06-28 ENCOUNTER — Ambulatory Visit: Payer: BC Managed Care – PPO | Attending: Internal Medicine

## 2019-06-28 DIAGNOSIS — Z23 Encounter for immunization: Secondary | ICD-10-CM

## 2019-06-28 NOTE — Progress Notes (Signed)
   Covid-19 Vaccination Clinic  Name:  Dana Fitzpatrick    MRN: [id number] DOB: 01/14/1971  06/28/2019  Dana Fitzpatrick was observed post Covid-19 immunization for 15 minutes without incident. She was provided with Vaccine Information Sheet and instruction to access the V-Safe system.   Dana Fitzpatrick was instructed to call 911 with any severe reactions post vaccine: Marland Kitchen Difficulty breathing  . Swelling of face and throat  . A fast heartbeat  . A bad rash all over body  . Dizziness and weakness   Immunizations Administered    Name Date Dose VIS Date Route   Pfizer COVID-19 Vaccine 06/28/2019 10:23 AM 0.3 mL 04/09/2018 Intramuscular   Manufacturer: [place]   Lot: [id number]   [place]: [id number]

## 2019-07-22 ENCOUNTER — Ambulatory Visit: Payer: BC Managed Care – PPO | Attending: Internal Medicine

## 2019-07-22 DIAGNOSIS — Z23 Encounter for immunization: Secondary | ICD-10-CM

## 2019-07-22 NOTE — Progress Notes (Signed)
   Covid-19 Vaccination Clinic  Name:  Dana Fitzpatrick    MRN: [id number] DOB: 1970/03/15  07/22/2019  [name] was observed post Covid-19 immunization for 15 minutes without incident. She was provided with Vaccine Information Sheet and instruction to access the V-Safe system.   [name] was instructed to call 911 with any severe reactions post vaccine: Marland Kitchen Difficulty breathing  . Swelling of face and throat  . A fast heartbeat  . A bad rash all over body  . Dizziness and weakness   Immunizations Administered    Name Date Dose VIS Date Route   Pfizer COVID-19 Vaccine 07/22/2019  3:28 PM 0.3 mL 04/09/2018 Intramuscular   Manufacturer: [place]   Lot: [id number]   [place]: [id number]

## 2019-11-17 IMAGING — CR DG CHEST 2V
1 series · 2 of 2 positions shown · non-contrast
Comparison: [DATE]

CLINICAL DATA: Shortness of breath for 2 weeks

EXAM:
CHEST - 2 VIEW

[Series 1: dg chest 2 view · 0.14mm/px · 2 of 2 slices shown]
[im 1/2]
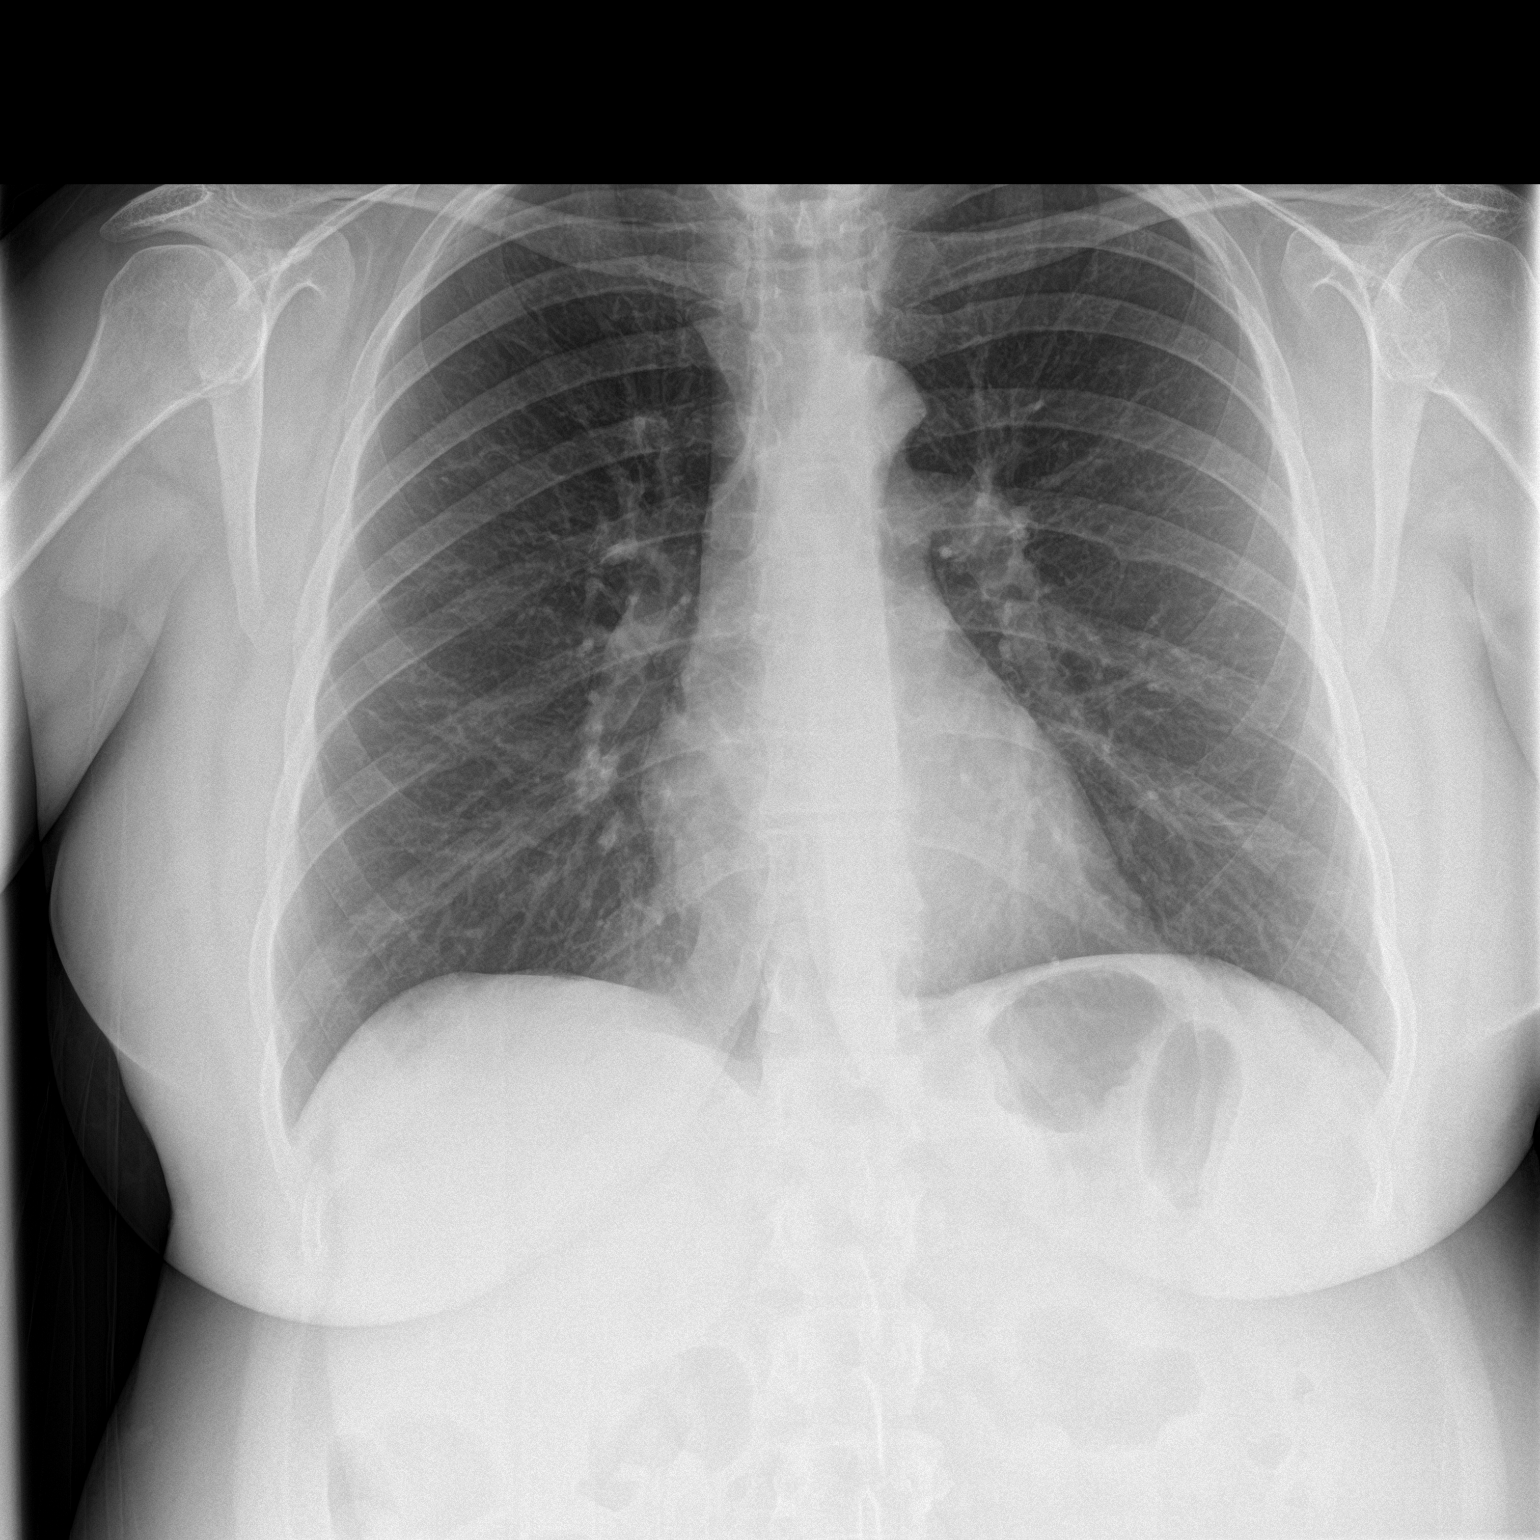
[im 2/2]
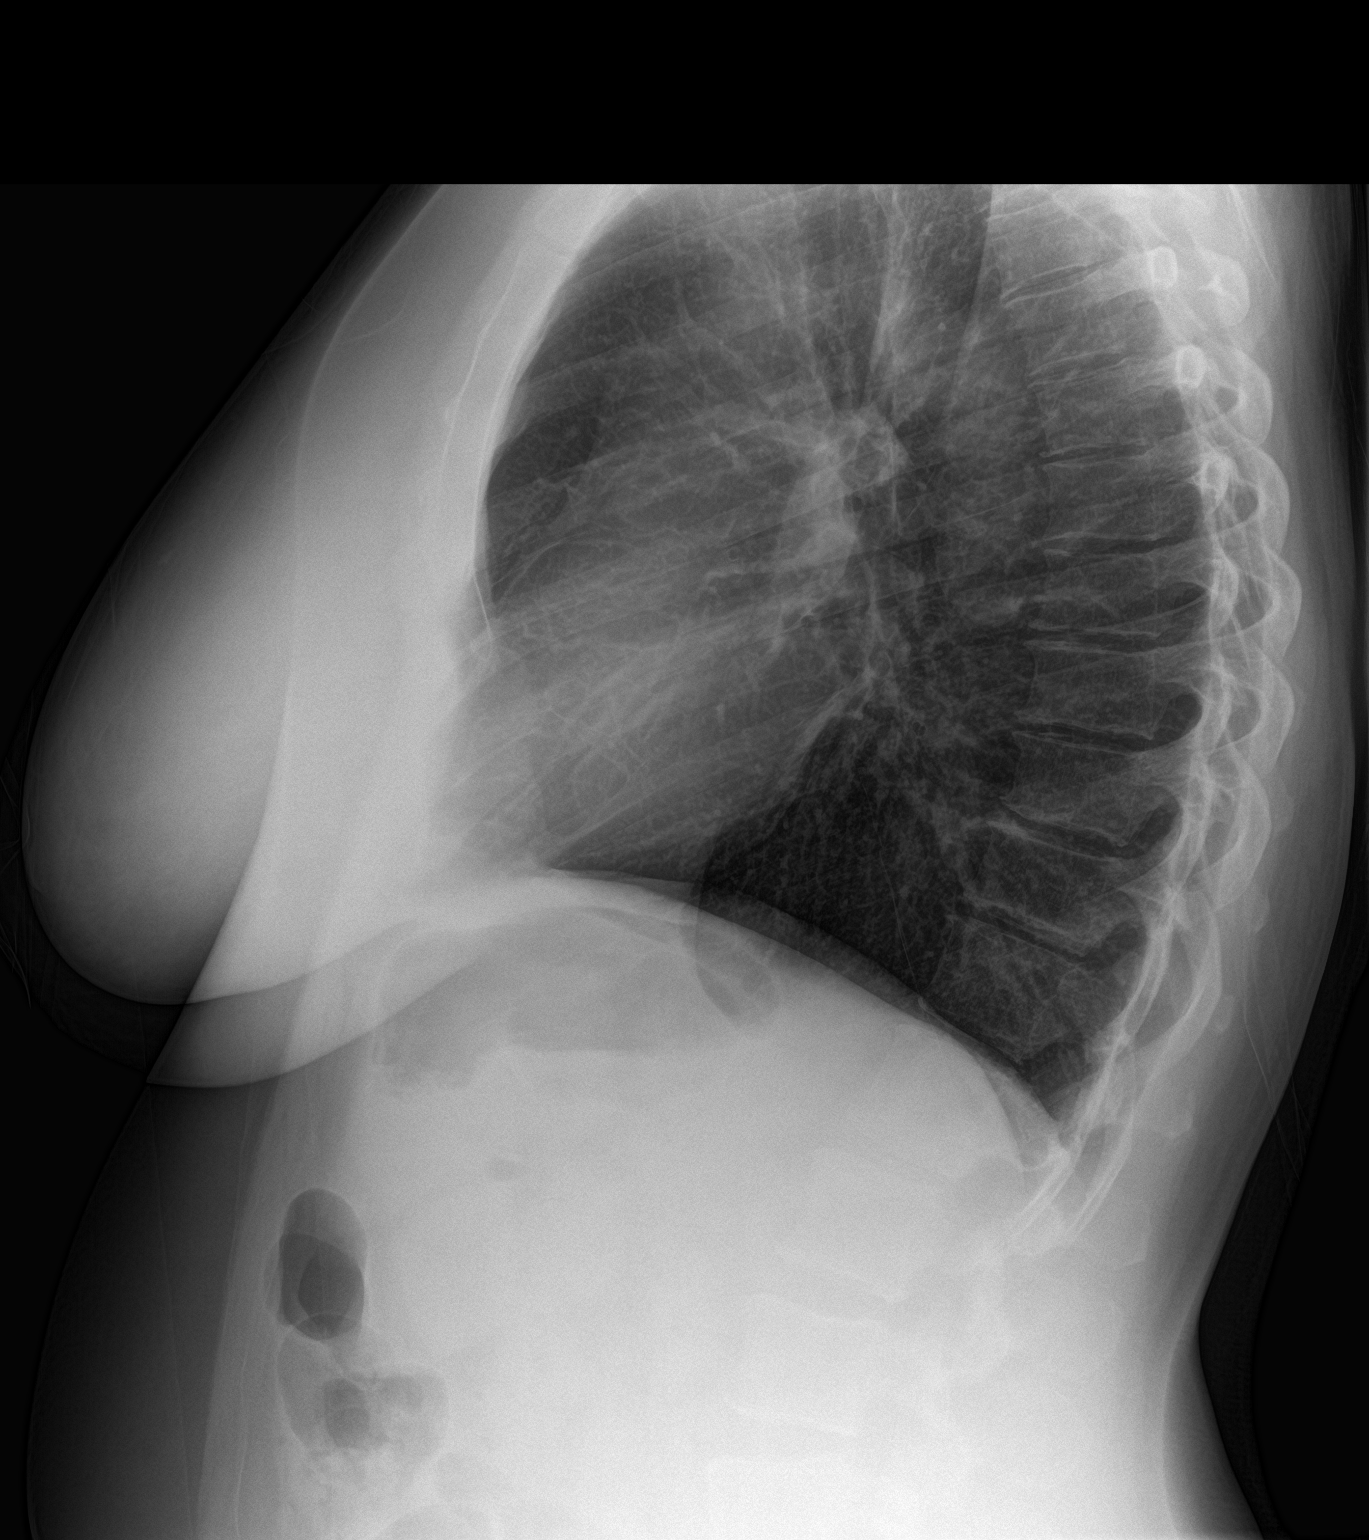

[2 of 2 positions shown; findings below may reference images not displayed]

FINDINGS: The heart size and mediastinal contours are within normal limits.
Both lungs are clear. The visualized skeletal structures are
unremarkable.
IMPRESSION: No active cardiopulmonary disease.
